# Patient Record
Sex: Female | Born: 1950
Health system: Southern US, Community
[De-identification: ages and names within clinical notes are randomized; demographics above are authoritative.]

## PROBLEM LIST (undated history)

## (undated) DIAGNOSIS — M810 Age-related osteoporosis without current pathological fracture: Secondary | ICD-10-CM

## (undated) DIAGNOSIS — R002 Palpitations: Secondary | ICD-10-CM

## (undated) DIAGNOSIS — E041 Nontoxic single thyroid nodule: Secondary | ICD-10-CM

## (undated) DIAGNOSIS — R030 Elevated blood-pressure reading, without diagnosis of hypertension: Secondary | ICD-10-CM

## (undated) DIAGNOSIS — G479 Sleep disorder, unspecified: Secondary | ICD-10-CM

## (undated) DIAGNOSIS — F411 Generalized anxiety disorder: Secondary | ICD-10-CM

## (undated) DIAGNOSIS — J310 Chronic rhinitis: Secondary | ICD-10-CM

## (undated) DIAGNOSIS — N952 Postmenopausal atrophic vaginitis: Secondary | ICD-10-CM

## (undated) DIAGNOSIS — J019 Acute sinusitis, unspecified: Secondary | ICD-10-CM

## (undated) DIAGNOSIS — IMO0001 Reserved for inherently not codable concepts without codable children: Secondary | ICD-10-CM

## (undated) DIAGNOSIS — K5780 Diverticulitis of intestine, part unspecified, with perforation and abscess without bleeding: Secondary | ICD-10-CM

## (undated) DIAGNOSIS — M8580 Other specified disorders of bone density and structure, unspecified site: Secondary | ICD-10-CM

## (undated) DIAGNOSIS — K589 Irritable bowel syndrome without diarrhea: Secondary | ICD-10-CM

## (undated) HISTORY — DX: Nontoxic single thyroid nodule: E04.1

## (undated) HISTORY — DX: Sleep disorder, unspecified: G47.9

## (undated) HISTORY — DX: Other specified disorders of bone density and structure, unspecified site: M85.80

## (undated) HISTORY — DX: Reserved for inherently not codable concepts without codable children: IMO0001

## (undated) HISTORY — DX: Acute sinusitis, unspecified: J01.90

## (undated) HISTORY — DX: Palpitations: R00.2

## (undated) HISTORY — DX: Irritable bowel syndrome without diarrhea: K58.9

## (undated) HISTORY — DX: Chronic rhinitis: J31.0

## (undated) HISTORY — DX: Generalized anxiety disorder: F41.1

## (undated) HISTORY — DX: Postmenopausal atrophic vaginitis: N95.2

## (undated) HISTORY — PX: BREAST SURGERY: SHX581

## (undated) HISTORY — DX: Elevated blood-pressure reading, without diagnosis of hypertension: R03.0

## (undated) HISTORY — DX: Age-related osteoporosis without current pathological fracture: M81.0

---
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
## 1993-12-25 HISTORY — PX: TEMPOROMANDIBULAR JOINT SURGERY: SHX35

## 2001-12-03 ENCOUNTER — Encounter: Payer: Self-pay | Admitting: *Deleted

## 2001-12-03 ENCOUNTER — Ambulatory Visit (HOSPITAL_COMMUNITY): Admission: RE | Admit: 2001-12-03 | Discharge: 2001-12-03 | Payer: Self-pay | Admitting: *Deleted

## 2002-01-15 ENCOUNTER — Other Ambulatory Visit: Admission: RE | Admit: 2002-01-15 | Discharge: 2002-01-15 | Payer: Self-pay | Admitting: Obstetrics and Gynecology

## 2002-08-27 ENCOUNTER — Encounter: Payer: Self-pay | Admitting: *Deleted

## 2002-08-27 ENCOUNTER — Encounter: Admission: RE | Admit: 2002-08-27 | Discharge: 2002-08-27 | Payer: Self-pay | Admitting: *Deleted

## 2002-12-25 HISTORY — PX: DILATION AND CURETTAGE OF UTERUS: SHX78

## 2003-01-05 ENCOUNTER — Ambulatory Visit (HOSPITAL_COMMUNITY): Admission: RE | Admit: 2003-01-05 | Discharge: 2003-01-05 | Payer: Self-pay | Admitting: Gastroenterology

## 2003-01-05 ENCOUNTER — Encounter: Payer: Self-pay | Admitting: Gastroenterology

## 2003-01-08 ENCOUNTER — Ambulatory Visit (HOSPITAL_COMMUNITY): Admission: RE | Admit: 2003-01-08 | Discharge: 2003-01-08 | Payer: Self-pay | Admitting: Gastroenterology

## 2003-01-08 ENCOUNTER — Encounter: Payer: Self-pay | Admitting: Gastroenterology

## 2003-03-18 ENCOUNTER — Other Ambulatory Visit: Admission: RE | Admit: 2003-03-18 | Discharge: 2003-03-18 | Payer: Self-pay | Admitting: *Deleted

## 2003-09-16 ENCOUNTER — Encounter (INDEPENDENT_AMBULATORY_CARE_PROVIDER_SITE_OTHER): Payer: Self-pay | Admitting: *Deleted

## 2003-09-16 ENCOUNTER — Ambulatory Visit (HOSPITAL_COMMUNITY): Admission: RE | Admit: 2003-09-16 | Discharge: 2003-09-16 | Payer: Self-pay | Admitting: Obstetrics and Gynecology

## 2003-10-07 ENCOUNTER — Encounter: Admission: RE | Admit: 2003-10-07 | Discharge: 2003-10-07 | Payer: Self-pay | Admitting: Obstetrics and Gynecology

## 2003-10-07 ENCOUNTER — Encounter: Payer: Self-pay | Admitting: Obstetrics and Gynecology

## 2004-04-13 ENCOUNTER — Other Ambulatory Visit: Admission: RE | Admit: 2004-04-13 | Discharge: 2004-04-13 | Payer: Self-pay | Admitting: Obstetrics and Gynecology

## 2004-08-15 ENCOUNTER — Ambulatory Visit (HOSPITAL_COMMUNITY): Admission: RE | Admit: 2004-08-15 | Discharge: 2004-08-15 | Payer: Self-pay | Admitting: Obstetrics and Gynecology

## 2004-10-10 ENCOUNTER — Ambulatory Visit (HOSPITAL_COMMUNITY): Admission: RE | Admit: 2004-10-10 | Discharge: 2004-10-10 | Payer: Self-pay | Admitting: Obstetrics and Gynecology

## 2004-10-18 ENCOUNTER — Ambulatory Visit (HOSPITAL_COMMUNITY): Admission: RE | Admit: 2004-10-18 | Discharge: 2004-10-18 | Payer: Self-pay | Admitting: Obstetrics and Gynecology

## 2005-12-05 ENCOUNTER — Encounter: Payer: Self-pay | Admitting: Internal Medicine

## 2010-01-25 LAB — CONVERTED CEMR LAB: Pap Smear: NORMAL

## 2010-09-22 ENCOUNTER — Encounter: Payer: Self-pay | Admitting: Internal Medicine

## 2010-10-04 ENCOUNTER — Ambulatory Visit: Payer: Self-pay | Admitting: Internal Medicine

## 2010-10-04 DIAGNOSIS — E041 Nontoxic single thyroid nodule: Secondary | ICD-10-CM | POA: Insufficient documentation

## 2010-10-04 DIAGNOSIS — IMO0001 Reserved for inherently not codable concepts without codable children: Secondary | ICD-10-CM

## 2010-10-04 DIAGNOSIS — R030 Elevated blood-pressure reading, without diagnosis of hypertension: Secondary | ICD-10-CM

## 2010-10-04 DIAGNOSIS — G479 Sleep disorder, unspecified: Secondary | ICD-10-CM | POA: Insufficient documentation

## 2010-10-04 HISTORY — DX: Sleep disorder, unspecified: G47.9

## 2010-10-04 HISTORY — DX: Nontoxic single thyroid nodule: E04.1

## 2010-10-04 HISTORY — DX: Reserved for inherently not codable concepts without codable children: IMO0001

## 2010-10-04 HISTORY — DX: Elevated blood-pressure reading, without diagnosis of hypertension: R03.0

## 2010-10-18 ENCOUNTER — Encounter: Payer: Self-pay | Admitting: Internal Medicine

## 2010-10-20 ENCOUNTER — Encounter: Payer: Self-pay | Admitting: Internal Medicine

## 2010-10-26 ENCOUNTER — Ambulatory Visit: Payer: Self-pay | Admitting: Internal Medicine

## 2010-10-26 DIAGNOSIS — K589 Irritable bowel syndrome without diarrhea: Secondary | ICD-10-CM

## 2010-10-26 DIAGNOSIS — F411 Generalized anxiety disorder: Secondary | ICD-10-CM

## 2010-10-26 DIAGNOSIS — R002 Palpitations: Secondary | ICD-10-CM

## 2010-10-26 HISTORY — DX: Generalized anxiety disorder: F41.1

## 2010-10-26 HISTORY — DX: Irritable bowel syndrome, unspecified: K58.9

## 2010-10-26 HISTORY — DX: Palpitations: R00.2

## 2010-11-02 ENCOUNTER — Encounter: Payer: Self-pay | Admitting: *Deleted

## 2010-11-02 LAB — CONVERTED CEMR LAB
BUN: 20 mg/dL (ref 6–23)
CO2: 32 meq/L (ref 19–32)
Calcium: 10.1 mg/dL (ref 8.4–10.5)
Chloride: 101 meq/L (ref 96–112)
Creatinine, Ser: 0.7 mg/dL (ref 0.4–1.2)
Free T4: 1.07 ng/dL (ref 0.60–1.60)
GFR calc non Af Amer: 88.21 mL/min (ref >60)
Glucose, Bld: 84 mg/dL (ref 70–99)
Potassium: 4.8 meq/L (ref 3.5–5.1)
Sodium: 142 meq/L (ref 135–145)
T3, Free: 2.2 pg/mL — ABNORMAL LOW (ref 2.3–4.2)
TSH: 0.66 microintl units/mL (ref 0.35–5.50)

## 2010-11-03 ENCOUNTER — Telehealth: Payer: Self-pay | Admitting: *Deleted

## 2010-11-08 ENCOUNTER — Encounter: Payer: Self-pay | Admitting: Internal Medicine

## 2010-11-10 ENCOUNTER — Encounter: Payer: Self-pay | Admitting: Internal Medicine

## 2010-11-23 ENCOUNTER — Ambulatory Visit: Payer: Self-pay | Admitting: Internal Medicine

## 2010-11-23 DIAGNOSIS — J310 Chronic rhinitis: Secondary | ICD-10-CM

## 2010-11-23 HISTORY — DX: Chronic rhinitis: J31.0

## 2010-11-28 ENCOUNTER — Ambulatory Visit: Payer: Self-pay | Admitting: Internal Medicine

## 2010-11-28 DIAGNOSIS — J019 Acute sinusitis, unspecified: Secondary | ICD-10-CM | POA: Insufficient documentation

## 2010-11-28 DIAGNOSIS — J069 Acute upper respiratory infection, unspecified: Secondary | ICD-10-CM | POA: Insufficient documentation

## 2010-11-28 HISTORY — DX: Acute sinusitis, unspecified: J01.90

## 2010-12-06 ENCOUNTER — Telehealth: Payer: Self-pay | Admitting: Internal Medicine

## 2010-12-13 ENCOUNTER — Telehealth: Payer: Self-pay | Admitting: *Deleted

## 2010-12-15 ENCOUNTER — Ambulatory Visit: Payer: Self-pay | Admitting: Family Medicine

## 2010-12-30 ENCOUNTER — Ambulatory Visit: Admit: 2010-12-30 | Payer: Self-pay | Admitting: Family Medicine

## 2011-01-02 ENCOUNTER — Encounter: Payer: Self-pay | Admitting: Internal Medicine

## 2011-01-24 NOTE — Progress Notes (Signed)
  Phone Note Call from Patient   Caller: Patient Call For: Madelin Headings MD Summary of Call: Pt. would like lab results. [phone] Initial call taken by: Lynann Beaver CMA AAMA,  November 03, 2010 10:56 AM  Follow-up for Phone Call        please see my append  Follow-up by: Madelin Headings MD,  November 03, 2010 12:26 PM  Additional Follow-up for Phone Call Additional follow up Details #1::        Lab results are normal. Pt aware. Additional Follow-up by: Romualdo Bolk, CMA Duncan Dull),  November 03, 2010 12:27 PM

## 2011-01-24 NOTE — Letter (Signed)
Summary: [facility]  [facility]   Imported By: Maryln Gottron 11/10/2010 08:47:53  _____________________________________________________________________  External Attachment:    Type:   Image     Comment:   External Document

## 2011-01-24 NOTE — Letter (Signed)
Summary: Records from [facility] 2009 - 2011  Records from [facility] 2009 - 2011   Imported By: Maryln Gottron 11/11/2010 13:10:11  _____________________________________________________________________  External Attachment:    Type:   Image     Comment:   External Document

## 2011-01-24 NOTE — Letter (Signed)
Summary: [facility]  [facility]   Imported By: Maryln Gottron 11/15/2010 10:16:49  _____________________________________________________________________  External Attachment:    Type:   Image     Comment:   External Document

## 2011-01-24 NOTE — Assessment & Plan Note (Signed)
Summary: SINUSITIS? // RS   Vital Signs:  Patient profile:   60 year old female Menstrual status:  postmenopausal Weight:      123 pounds O2 Sat:      99 % on Room air Temp:     98.6 degrees F oral Pulse rate:   78 / minute BP sitting:   120 / 80  (left arm) Cuff size:   regular  Vitals Entered By: Romualdo Bolk, CMA (AAMA) (November 28, 2010 11:49 AM)  O2 Flow:  Room air CC: No voice on 12/1-12/3. Then on 12/3 nasal congestion with coughing and chest congestion in am. Some sinus pressure on rt side and ha's.   History of Present Illness: Denise Young comes in today  for above  .  indeed her symptoms progressed  from last visit and had the sudden  onset of laryngitis  and then head congestoin since then. Like a   head cold  .  NO fever.    Right sided face  pain  was problematic esp bending over yesterday     somewhat better  today .     But has to travel back to [place]  this week  to help with mom .     Bp doing better and not on meds as before.  No fever sig cough or cp sob .        Preventive Screening-Counseling & Management  Alcohol-Tobacco     Alcohol drinks/day: 0     Smoking Status: never     Year Quit: 1982     Tobacco Counseling: not to resume use of tobacco products  Caffeine-Diet-Exercise     Caffeine use/day: less than 1 a day     Does Patient Exercise: yes     Type of exercise: walking  Current Medications (verified): 1)  Diazepam 5 Mg Tabs (Diazepam) .Marland Kitchen.. 1 By Mouth At Bedtime As Needed For Muscle Spasms 2)  Propranolol Hcl 10 Mg Tabs (Propranolol Hcl) .Marland Kitchen.. 1 By Mouth Three Times A Day As Needed For Blood Pressure  Currently Not Taking 3)  Metoprolol Succinate 25 Mg Xr24h-Tab (Metoprolol Succinate) .... Take 1/2 By Mouth Once Daily  For  Supression of Palpitations and High Blood Pressure  or As Directed  Not Taking  Allergies (verified): 1)  Neomycin 2)  Adhesive Tape  Past History:  Past medical, surgical, family and social histories  (including risk factors) reviewed, and no changes noted (except as noted below).  Past Medical History: Reviewed history from 10/26/2010 and no changes required. thyroid nodule  yearly check   G1P1  last PAP 2 2011  Hx breech birth csection preeclampsia DEXA mild osteopenia  -1.7 fem neck and -2.0 LSsp 2 2011 Fibromyalgia    age 60 post partum  Elevated  BP readings .  Increase on hrt  ETT 2009  negative  failr exercise tolerance Echo cardiogram 12 2008  nl LV " difficult technically" IBS Psoriasis Hx of PUD by 1gd 1980s   Past Surgical History: Reviewed history from 10/04/2010 and no changes required. C-Section 1989 2 Rt breast bx- 1992 and 1994 Left TMJ surgery 1995 Colonoscopy 2008  Past History:  Care Management: Endocrinology: Alheimer thyroid nodule [facility]: Dr Sid Falcon  Family History: Reviewed history from 10/04/2010 and no changes required. Father: Deceased age 55- Heart disease, Eye Problems- unsure of type Mother: Mild HBP, hypothryroid  Siblings: 1) brother- scramouse ca of neck, hypothryroid 2) Brother- open heart surgery, by pass,  hbp, dm 3)Sister-Hypothryoidism 4) Sister- Migraines 5) Brother- HBP and High cholesterol  Social History: Reviewed history from 10/26/2010 and no changes required. 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  The patient denies anorexia, fever, weight loss, weight gain, vision loss, chest pain, hemoptysis, difficulty walking, abnormal bleeding, and enlarged lymph nodes.    Physical Exam  General:  Well-developed,well-nourished,in no acute distress; alert,appropriate and cooperative throughout examinationcongested and midly hoarse Head:  normocephalic and atraumatic.   Eyes:  vision grossly intact, pupils equal, and pupils round.     Ears:  R ear normal, L ear normal, and no external deformities.   Nose:  no external deformity and no external erythema.  congested   no discrete tenderenss right  Mouth:  pharynx pink and moist.   Neck:  No deformities, masses, or tenderness noted. Lungs:  Normal respiratory effort, chest expands symmetrically. Lungs are clear to auscultation, no crackles or wheezes. Heart:  Normal rate and regular rhythm. S1 and S2 normal without gallop, murmur, click, rub or other extra sounds. Skin:  turgor normal, color normal, no ecchymoses, and no petechiae.   Cervical Nodes:  No lymphadenopathy noted Psych:  Oriented X3, normally interactive, good eye contact, and not depressed appearing.     Impression & Recommendations:  Problem # 1:  SINUSITIS - ACUTE-NOS (ICD-461.9) Expectant management  add med if needed  Her updated medication list for this problem includes:    Amoxicillin 500 Mg Caps (Amoxicillin) .Marland Kitchen... 1 by mouth three times a day for sinusitis  Problem # 2:  URI (ICD-465.9) Assessment: Comment Only  Problem # 3:  ELEVATED BP READING WITHOUT DX HYPERTENSION (ICD-796.2) Assessment: Improved  Her updated medication list for this problem includes:    Propranolol Hcl 10 Mg Tabs (Propranolol hcl) .Marland Kitchen... 1 by mouth three times a day as needed for blood pressure  currently not taking    Metoprolol Succinate 25 Mg Xr24h-tab (Metoprolol succinate) .Marland Kitchen... Take 1/2 by mouth once daily  for  supression of palpitations and high blood pressure  or as directed  not taking  Complete Medication List: 1)  Diazepam 5 Mg Tabs (Diazepam) .Marland Kitchen.. 1 by mouth at bedtime as needed for muscle spasms 2)  Propranolol Hcl 10 Mg Tabs (Propranolol hcl) .Marland Kitchen.. 1 by mouth three times a day as needed for blood pressure  currently not taking 3)  Metoprolol Succinate 25 Mg Xr24h-tab (Metoprolol succinate) .... Take 1/2 by mouth once daily  for  supression of palpitations and high blood pressure  or as directed  not taking 4)   Amoxicillin 500 Mg Caps (Amoxicillin) .Marland Kitchen.. 1 by mouth three times a day for sinusitis  Patient Instructions: 1)  continue   current   therapy  and if right -sided discomfort continues another  1-2 days .    then add the antibiotic .  2)  can call  for advice . Prescriptions: AMOXICILLIN 500 MG CAPS (AMOXICILLIN) 1 by mouth three times a day for sinusitis  #30 x 0   Entered and Authorized by:   Madelin Headings MD   Signed by:   Madelin Headings MD on 11/28/2010   Method used:   Print then Give to Patient   RxID:   [id number]    Orders Added: 1)  Est. Patient Level  IV [id number]

## 2011-01-24 NOTE — Assessment & Plan Note (Signed)
Summary: 1 month fup//ccm   Vital Signs:  Patient profile:   60 year old female Menstrual status:  postmenopausal Weight:      123 pounds O2 Sat:      96 % Temp:     98.3 degrees F oral Pulse rate:   91 / minute Pulse rhythm:   regular BP sitting:   100 / 66  Vitals Entered By: Lynann Beaver CMA AAMA (November 23, 2010 11:22 AM) CC: URI symptoms Is Patient Diabetic? No Pain Assessment Patient in pain? no        History of Present Illness: Denise Young   comes in today  for follow up of bp and also resp issues.  After travel and burning and tingling in nasal passages for 2-3 days.   NOw has to go to [place] for a funeral. asks for advice.  BP doing better and not having to take med. Has tried 1/2 diazepa at night and no real help.  so on nothing at night.  bp normal today.  Preventive Screening-Counseling & Management  Alcohol-Tobacco     Alcohol drinks/day: 0     Smoking Status: never     Year Quit: 1982     Tobacco Counseling: not to resume use of tobacco products  Caffeine-Diet-Exercise     Caffeine use/day: less than 1 a day     Does Patient Exercise: yes     Type of exercise: walking  Current Problems (verified): 1)  Rhinitis  (ICD-472.0) 2)  Other Anxiety States  (ICD-300.09) 3)  Ibs  (ICD-564.1) 4)  Palpitations, Recurrent  (ICD-785.1) 5)  Sleep Disorder/disturbance  (ICD-780.50) 6)  Elevated Bp Reading Without Dx Hypertension  (ICD-796.2) 7)  Thyroid Nodule  (ICD-241.0) 8)  Fibromyalgia  (ICD-729.1)  Current Medications (verified): 1)  Diazepam 5 Mg Tabs (Diazepam) .Marland Kitchen.. 1 By Mouth At Bedtime As Needed For Muscle Spasms 2)  Propranolol Hcl 10 Mg Tabs (Propranolol Hcl) .Marland Kitchen.. 1 By Mouth Three Times A Day As Needed For Blood Pressure 3)  Metoprolol Succinate 25 Mg Xr24h-Tab (Metoprolol Succinate) .... Take 1/2 By Mouth Once Daily  For  Supression of Palpitations and High Blood Pressure  or As Directed  Allergies (verified): 1)  Neomycin 2)  Adhesive  Tape  Past History:  Past medical, surgical, family and social histories (including risk factors) reviewed, and no changes noted (except as noted below).  Past Medical History: Reviewed history from 10/26/2010 and no changes required. thyroid nodule  yearly check   G1P1  last PAP 2 2011  Hx breech birth csection preeclampsia DEXA mild osteopenia  -1.7 fem neck and -2.0 LSsp 2 2011 Fibromyalgia    age 32 post partum  Elevated  BP readings .  Increase on hrt  ETT 2009  negative  failr exercise tolerance Echo cardiogram 12 2008  nl LV " difficult technically" IBS Psoriasis Hx of PUD by 1gd 1980s   Past Surgical History: Reviewed history from 10/04/2010 and no changes required. C-Section 1989 2 Rt breast bx- 1992 and 1994 Left TMJ surgery 1995 Colonoscopy 2008  Family History: Reviewed history from 10/04/2010 and no changes required. Father: Deceased age 51- Heart disease, Eye Problems- unsure of type Mother: Mild HBP, hypothryroid  Siblings: 1) brother- scramouse ca of neck, hypothryroid 2) Brother- open heart surgery, by pass, hbp, dm 3)Sister-Hypothryoidism 4) Sister- Migraines 5) Brother- HBP and High cholesterol  Social History: Reviewed history from 10/26/2010 and no changes required. 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    Physical Exam  General:  Well-developed,well-nourished,in no acute distress; alert,appropriate and cooperative throughout examination Head:  normocephalic and atraumatic.   Eyes:  vision grossly intact, pupils equal, and pupils round.   Ears:  R ear normal and L ear normal.   Nose:  no external deformity, no  external erythema, and no nasal discharge.  mild erythema no face tenderness Mouth:  pharynx pink and moist.   Neck:  No deformities, masses, or tenderness noted. Lungs:  normal respiratory effort, no intercostal retractions, and no accessory muscle use.   Heart:  normal rate, regular rhythm, and no murmur.     Impression & Recommendations:  Problem # 1:  RHINITIS (ICD-472.0) eary viral vs environmental dryness and irritation.  Expectant management   saline etc.  call with alarm symptoms   Problem # 2:  ELEVATED BP READING WITHOUT DX HYPERTENSION (ICD-796.2) good bp readings now and not taking meds  because doing wll  tends to runelevated HT   .  Her updated medication list for this problem includes:    Propranolol Hcl 10 Mg Tabs (Propranolol hcl) .Marland Kitchen... 1 by mouth three times a day as needed for blood pressure  currently not taking    Metoprolol Succinate 25 Mg Xr24h-tab (Metoprolol succinate) .Marland Kitchen... Take 1/2 by mouth once daily  for  supression of palpitations and high blood pressure  or as directed  not taking  Complete Medication List: 1)  Diazepam 5 Mg Tabs (Diazepam) .Marland Kitchen.. 1 by mouth at bedtime as needed for muscle spasms 2)  Propranolol Hcl 10 Mg Tabs (Propranolol hcl) .Marland Kitchen.. 1 by mouth three times a day as needed for blood pressure  currently not taking 3)  Metoprolol Succinate 25 Mg Xr24h-tab (Metoprolol succinate) .... Take 1/2 by mouth once daily  for  supression of palpitations and high blood pressure  or as directed  not taking  Patient Instructions: 1)  continue saline   washes   2)  cool mist vaporizer at night  3)  plain Mucinex or variation should be ok .  4)  This could get worse before it  gets better if its a viral resp infection. 5)  Tylenol  is ok.     Orders Added: 1)  Est. Patient Level III [04540]

## 2011-01-24 NOTE — Letter (Signed)
Summary: PT. HX FORM  PT. HX FORM   Imported By: Georgian Co 11/10/2010 11:04:45  _____________________________________________________________________  External Attachment:    Type:   Image     Comment:   External Document

## 2011-01-24 NOTE — Assessment & Plan Note (Signed)
Summary: elevated bp//ccm   Vital Signs:  Patient profile:   60 year old female Menstrual status:  postmenopausal Weight:      122 pounds Pulse rate:   84 / minute BP sitting:   130 / 74  (right arm) Cuff size:   regular  Vitals Entered By: Romualdo Bolk, CMA (AAMA) (October 26, 2010 3:18 PM)  Serial Vital Signs/Assessments:  Time      Position  BP       Pulse  Resp  Temp     By 3:21 PM             137/78   87                    Romualdo Bolk, CMA (AAMA)  Comments: 3:21 PM Pt's machine By: Romualdo Bolk, CMA (AAMA)   CC: BP elevated   History of Present Illness: Denise Young comes in today  for  problem  as an sda.  She has concerns about her BP readings .  She brings in copy of results most recently  some in 150 /70 range and some in 130s about 50 % of the time. Tkaing prolpranolol as needed  a few times and this helps her.    She gets a pounding   feeling in back and neck and up to 98 and 119  then take proprnolol as a as needed with help.   She brings in a copy of echo cardiogram done in the past 12/08 for palpitations  tis was normal   She may have also had an event monitor  wihtout sig abnormality in the past.   She thinks that stress could be alarge part of problem  .    Gets anxious with this. Worried if thyroid could be off also.  Preventive Screening-Counseling & Management  Alcohol-Tobacco     Alcohol drinks/day: 0     Smoking Status: never     Year Quit: 1982     Tobacco Counseling: not to resume use of tobacco products  Caffeine-Diet-Exercise     Caffeine use/day: less than 1 a day     Does Patient Exercise: yes     Type of exercise: walking  Contraindications/Deferment of Procedures/Staging:    Test/Procedure: FLU VAX    Reason for deferment: patient declined   Current Medications (verified): 1)  Diazepam 5 Mg Tabs (Diazepam) .Marland Kitchen.. 1 By Mouth At Bedtime As Needed For Muscle Spasms 2)  Propranolol Hcl 10 Mg Tabs (Propranolol Hcl) .Marland Kitchen..  1 By Mouth Three Times A Day As Needed For Blood Pressure 3)  Diphenhydramine Hcl 25 Mg Caps (Diphenhydramine Hcl)  Allergies (verified): 1)  Neomycin 2)  Adhesive Tape  Past History:  Past medical, surgical, family and social histories (including risk factors) reviewed, and no changes noted (except as noted below).  Past Medical History: thyroid nodule  yearly check   G1P1  last PAP 2 2011  Hx breech birth csection preeclampsia DEXA mild osteopenia  -1.7 fem neck and -2.0 LSsp 2 2011 Fibromyalgia    age 34 post partum  Elevated  BP readings .  Increase on hrt  ETT 2009  negative  failr exercise tolerance Echo cardiogram 12 2008  nl LV " difficult technically" IBS Psoriasis Hx of PUD by 1gd 1980s   Past Surgical History: Reviewed history from 10/04/2010 and no changes required. C-Section 1989 2 Rt breast bx- 1992 and 1994 Left TMJ surgery 1995 Colonoscopy 2008  Past History:  Care Management: Endocrinology: Alheimer thyroid nodule [facility]: Dr Sid Falcon  Family History: Reviewed history from 10/04/2010 and no changes required. Father: Deceased age 11- Heart disease, Eye Problems- unsure of type Mother: Mild HBP, hypothryroid  Siblings: 1) brother- scramouse ca of neck, hypothryroid 2) Brother- open heart surgery, by pass, hbp, dm 3)Sister-Hypothryoidism 4) Sister- Migraines 5) Brother- HBP and High cholesterol  Social History: Reviewed history from 10/04/2010 and no changes required. 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    Physical Exam  General:  Well-developed,well-nourished,in no acute distress; alert,appropriate and cooperative throughout examination mildy anxious  Neck:  No deformities, masses, or tenderness noted. Lungs:  Normal respiratory effort, chest expands symmetrically. Lungs are clear to auscultation, no crackles or wheezes. Heart:  Normal rate and regular rhythm. S1 and S2 normal without gallop, murmur, click, rub or other extra sounds.no lifts.   Pulses:  pulses intact without delay   Extremities:  no clubbing cyanosis or edema  Neurologic:  grssly non focal  Skin:  turgor normal and color normal.   Cervical Nodes:  No lymphadenopathy noted Psych:  Oriented X3, normally interactive, good eye contact, not depressed appearing, and slightly anxious.   nl mentation   Impression & Recommendations:  Problem # 1:  PALPITATIONS, RECURRENT (ICD-785.1) hx of same and prev evlauations.   no dx.  Her updated medication list for this problem includes:    Propranolol Hcl 10 Mg Tabs (Propranolol hcl) .Marland Kitchen... 1 by mouth three times a day as needed for blood pressure    Metoprolol Succinate 25 Mg Xr24h-tab (Metoprolol succinate) .Marland Kitchen... Take 1/2 by mouth once daily  for  supression of palpitations and high blood pressure  or as directed  Orders: Specimen Handling (81191) Venipuncture (47829) TLB-BMP (Basic Metabolic Panel-BMET) (80048-METABOL) TLB-TSH (Thyroid Stimulating Hormone) (84443-TSH) TLB-T4 (Thyrox), Free [phone]) TLB-T3, Free (Triiodothyronine) (84481-T3FREE)  Problem # 2:  ELEVATED BP READING WITHOUT DX HYPERTENSION (ICD-796.2) review of her  readings areelevated about 50% of the time   reasonable to use b blocker on a reg basis low dose  Her updated medication list for this problem includes:    Propranolol Hcl 10 Mg Tabs (Propranolol hcl) .Marland Kitchen... 1 by mouth three times a day as needed for blood pressure    Metoprolol Succinate 25 Mg Xr24h-tab (Metoprolol succinate) .Marland Kitchen...  Take 1/2 by mouth once daily  for  supression of palpitations and high blood pressure  or as directed  Orders: Specimen Handling (78469) Venipuncture (62952) TLB-BMP (Basic Metabolic Panel-BMET) (80048-METABOL) TLB-TSH (Thyroid Stimulating Hormone) (84443-TSH)  Problem # 3:  FIBROMYALGIA (ICD-729.1)  Problem # 4:  OTHER ANXIETY STATES (ICD-300.09) disc risk benefit of valium type medications Her updated medication list for this problem includes:    Diazepam 5 Mg Tabs (Diazepam) .Marland Kitchen... 1 by mouth at bedtime as needed for muscle spasms  Complete Medication List: 1)  Diazepam 5 Mg Tabs (Diazepam) .Marland Kitchen.. 1 by mouth at bedtime as needed for muscle spasms 2)  Propranolol Hcl 10 Mg Tabs (Propranolol hcl) .Marland Kitchen.. 1 by mouth three times a day as needed for blood pressure 3)  Diphenhydramine Hcl  25 Mg Caps (Diphenhydramine hcl) 4)  Metoprolol Succinate 25 Mg Xr24h-tab (Metoprolol succinate) .... Take 1/2 by mouth once daily  for  supression of palpitations and high blood pressure  or as directed  Patient Instructions: 1)  take  1/2 of the diazepam hs for 3 night s to see    if helps day time   problems.   or sleep. 2)  consider   daily low dose  beta blocker  3)  You will be informed of lab results when available.  4)  Please schedule a follow-up appointment in 1 month.  Prescriptions: METOPROLOL SUCCINATE 25 MG XR24H-TAB (METOPROLOL SUCCINATE) take 1/2 by mouth once daily  for  supression of palpitations and high blood pressure  or as directed  #30 x 1   Entered and Authorized by:   Madelin Headings MD   Signed by:   Madelin Headings MD on 10/26/2010   Method used:   Print then Give to Patient   RxID:   [id number]    Orders Added: 1)  Specimen Handling [99000] 2)  Venipuncture [36415] 3)  TLB-BMP (Basic Metabolic Panel-BMET) [80048-METABOL] 4)  TLB-TSH (Thyroid Stimulating Hormone) [84443-TSH] 5)  TLB-T4 (Thyrox), Free [13244-WN0U] 6)  TLB-T3, Free (Triiodothyronine) [72536-U4QIHK] 7)   Est. Patient Level V [74259]   greater than 50% of visit spent in counseling  40 minutes     Appended Document: elevated bp//ccm recieved  OV report  from [facility] note from 10 /27   after above visit.  At that visit it is reported that she is was given bystolic 2.5 and had some kind of side effect .

## 2011-01-24 NOTE — Assessment & Plan Note (Signed)
Summary: BRAND NEW PT/TO EST/CJR   Vital Signs:  Patient profile:   60 year old female Menstrual status:  postmenopausal Height:      63.75 inches Weight:      121 pounds BMI:     21.01 Pulse rate:   72 / minute BP sitting:   110 / 70  (left arm) Cuff size:   regular  Vitals Entered By: Romualdo Bolk, CMA (AAMA) (October 04, 2010 1:32 PM)  Serial Vital Signs/Assessments:  Time      Position  BP       Pulse  Resp  Temp     By                     138/78                         Madelin Headings MD  Comments: right arm sitting  By: Madelin Headings MD   CC: New Pt to get establish-Discuss flu shot. Pt has been having problems sleeping and pulse has been going fast.     Menstrual Status postmenopausal Last PAP Result normal   History of Present Illness: Denise Young comes in today    as a new patient to establish . Her previous care was in [place]  from whence she moved  July . Moved back to [facility] where she had lived  from  2005  .    She describes  her situation as having  "FM   and challenges. "  no acute  illnesses.   uses  diazepam as needed spasm   .  not taken  very often.  She is very sensitive to meds such as neosporin adhesives and latex   She has also had a se of a flu vaccine in the past  but not allergic in nature  Preventive Care Screening  Pap Smear:    Date:  01/25/2010    Results:  normal   Mammogram:    Date:  12/26/2007    Results:  normal   Colonoscopy:    Date:  12/25/2006    Results:  normal   Last Tetanus Booster:    Date:  12/25/2000    Results:  Historical    Preventive Screening-Counseling & Management  Alcohol-Tobacco     Alcohol drinks/day: 0     Smoking Status: never     Year Quit: 1982     Tobacco Counseling: not to resume use of tobacco products  Caffeine-Diet-Exercise     Caffeine use/day: less than 1 a day     Does Patient Exercise: yes     Type of exercise: walking  Comments: 12 years of tobacco       Drug Use:   no.    Current Medications (verified): 1)  Diazepam 5 Mg Tabs (Diazepam) .Marland Kitchen.. 1 By Mouth At Bedtime As Needed For Muscle Spasms 2)  Propranolol Hcl 10 Mg Tabs (Propranolol Hcl) .Marland Kitchen.. 1 By Mouth Three Times A Day As Needed For Blood Pressure 3)  Diphenhydramine Hcl 25 Mg Caps (Diphenhydramine Hcl)  Allergies (verified): 1)  Neomycin 2)  Adhesive Tape  Past History:  Past Medical History: thyroid nodule  yearly check   G1P1  last PAP 2 2011 DEXA mild osteopenia  -1.7 fem neck and -2.0 LSsp 2 2011 Fibromyalgia    age 32 post partum  Elevated  BP readings .  ETT 2009  negative  failr exercise tolerance  Past Surgical History: C-Section 1989 2 Rt breast bx- 1992 and 1994 Left TMJ surgery 1995 Colonoscopy 2008  Past History:  Care Management: Endocrinology: Alheimer thyroid nodule [facility] Dr Sid Falcon  Family History: Father: Deceased age 58- Heart disease, Eye Problems- unsure of type Mother: Mild HBP, hypothryroid  Siblings: 1) brother- scramouse ca of neck, hypothryroid 2) Brother- open heart surgery, by pass, hbp, dm 3)Sister-Hypothryoidism 4) Sister- Migraines 5) Brother- HBP and High cholesterol  Social History: Married   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         bp up and down  160 and pulse 98   taking propranolol.      as needed.  sleep is an issue.     Physical Exam  General:   Well-developed,well-nourished,in no acute distress; alert,appropriate and cooperative throughout examination Head:  normocephalic and atraumatic.   Eyes:  vision grossly intact, pupils equal, and pupils round.   Ears:  R ear normal, L ear normal, and no external deformities.   Nose:  no external deformity and no external erythema.   Mouth:  pharynx pink and moist.   Neck:  No deformities, masses, or tenderness noted. Lungs:  Normal respiratory effort, chest expands symmetrically. Lungs are clear to auscultation, no crackles or wheezes.no dullness.   Heart:  Normal rate and regular rhythm. S1 and S2 normal without gallop, murmur, click, rub or other extra sounds.no lifts.   Abdomen:  Bowel sounds positive,abdomen soft and non-tender without masses, organomegaly or   noted. Msk:  no joint swelling and no joint warmth.   Pulses:  pulses intact without delay   Extremities:  no clubbing cyanosis or edema  Neurologic:   alert & oriented X3 and gait normal.  non focal  Skin:  turgor normal, color normal, no ecchymoses, and no petechiae.   Cervical Nodes:  No lymphadenopathy noted Psych:  Oriented X3, good eye contact, not anxious appearing, and not depressed appearing.     Impression & Recommendations:  Problem # 1:  ELEVATED BP READING WITHOUT DX HYPERTENSION (ICD-796.2) hx of same .  also palpititations and used b blocker as needed.    hx of ett in 2009 and neg for ischemia Her updated medication list for this problem includes:    Propranolol Hcl 10 Mg Tabs (Propranolol hcl) .Marland Kitchen... 1 by mouth three times a day as needed for blood pressure  Problem # 2:  THYROID NODULE (ICD-241.0) followerd by endocrine an stable by report   Problem # 3:  FIBROMYALGIA (ICD-729.1) stable  acitivity.    Problem # 4:  SLEEP DISORDER/DISTURBANCE (ICD-780.50) could be related to Fm and or stransitions  Complete Medication List: 1)  Diazepam 5 Mg Tabs (Diazepam) .Marland Kitchen.. 1 by mouth at bedtime as needed for muscle  spasms 2)  Propranolol Hcl 10 Mg Tabs (Propranolol hcl) .Marland Kitchen.. 1 by mouth three times a day as needed for blood pressure 3)  Diphenhydramine Hcl 25 Mg Caps (Diphenhydramine hcl)  Patient Instructions: 1)  revisit sleep hygiene .  2)  avoid  back lighting  before bed.  3)  Cpx and labs in February  2012.  4)  Call if BP readings  over 160. 5)  bring in  a summary of your readings when come in then .  records  from previous caretakers  obtained and reviewed

## 2011-01-24 NOTE — Letter (Signed)
Summary: Generic Letter  [place] [facility]  [address] [place], [place] [address]   Phone: [phone]  Fax: [phone]    11/02/2010  [facility] [address] [place], [place]  [address]  Dear Ms. Fraiser,   (1) BMP (METABOL)   Sodium                    142 mEq/L                   135-145   Potassium                 4.8 mEq/L                   3.5-5.1   Chloride                  101 mEq/L                   96-112   Carbon Dioxide            32 mEq/L                    19-32   Glucose                   84 mg/dL                    27-25   BUN                       20 mg/dL                    3-66   Creatinine                0.7 mg/dL                   4.4-0.3   Calcium                   10.1 mg/dL                  4.7-42.5   GFR                       88.21 mL/min                >60  Tests: (2) TSH (TSH)   FastTSH                   0.66 uIU/mL                 0.35-5.50  Tests: (3) T4, Free (FT4R)   Free T4                   1.07 ng/dL                  0.60-1.60  Tests: (4) T3, Free (T3FREE)   Free T3              [L]  2.2 pg/mL                   2.3-4.2   Your labs are okay and we will discuss this when you come in for your next appt. If you have any questions, please give [place] a call at [phone].     Sincerely,   Tor Netters,  CMA (AAMA)

## 2011-01-26 NOTE — Assessment & Plan Note (Signed)
Summary: EAR PAIN // RS   Vital Signs:  Patient profile:   60 year old female Menstrual status:  postmenopausal Temp:     97.8 degrees F oral BP sitting:   130 / 78  (left arm) Cuff size:   regular  Vitals Entered By: Duard Brady LPN (December 15, 2010 1:53 PM)  History of Present Illness: 3 weeks ago laryngitis and cold symptoms. Eventually started Amox after not getting better. Had insomnia with antibiotic.  changed to Ceftin.  Now has L ear pressure.  No real pain.  No vertigo. No fever.  Denies hearing loss.  Allergies: 1)  Neomycin 2)  Adhesive Tape  Past History:  Past Medical History: Last updated: 10/26/2010 thyroid nodule  yearly check   G1P1  last PAP 2 2011  Hx breech birth csection preeclampsia DEXA mild osteopenia  -1.7 fem neck and -2.0 LSsp 2 2011 Fibromyalgia    age 38 post partum  Elevated  BP readings .  Increase on hrt  ETT 2009  negative  failr exercise tolerance Echo cardiogram 12 2008  nl LV " difficult technically" IBS Psoriasis Hx of PUD by 1gd 1980s   Physical Exam  General:  Well-developed,well-nourished,in no acute distress; alert,appropriate and cooperative throughout examination Ears:  L ear impacted wih cerumen.  R tm minimal cerumen TM normal after removal of cerumen. Mouth:  Oral mucosa and oropharynx without lesions or exudates.  Teeth in good repair. Neck:  No deformities, masses, or tenderness noted. Lungs:  Normal respiratory effort, chest expands symmetrically. Lungs are clear to auscultation, no crackles or wheezes. Heart:  normal rate and regular rhythm.     Impression & Recommendations:  Problem # 1:  IMPACTED CERUMEN (ICD-380.4) removed by currett and irrigation.  Problem # 2:  URI (ICD-465.9) resolving.  Complete Medication List: 1)  Diazepam 5 Mg Tabs (Diazepam) .Marland Kitchen.. 1 by mouth at bedtime as needed for muscle spasms 2)  Propranolol Hcl 10 Mg Tabs (Propranolol hcl) .Marland Kitchen.. 1 by mouth three times a day as needed  for blood pressure  currently not taking 3)  Metoprolol Succinate 25 Mg Xr24h-tab (Metoprolol succinate) .... Take 1/2 by mouth once daily  for  supression of palpitations and high blood pressure  or as directed  not taking 4)  Cefprozil 500 Mg Tabs (Cefprozil) .... One by mouth two times a day x 7 days   Orders Added: 1)  Est. Patient Level III [30865]

## 2011-01-26 NOTE — Progress Notes (Signed)
Summary: ov today-triage please per md  Phone Note Call from Patient Call back at Home Phone [phone]   Caller: Patient Call For: Madelin Headings MD Summary of Call: Pt did not take abx ceftin, she is requesting ov with doc today for sinus problems  Has symptoms of congestion, and stuffiness and dry.  Took Mucinex today. Is afraid of all the side effectd on the Cefzil, and want to be seen instead of taking meds.  The symptoms have been going on 3 weeks. No fever.  No sore throat, and minimal dry cough. Initial call taken by: Drue Stager,  December 13, 2010 10:16 AM  Follow-up for Phone Call        i did not see the message  until late today as   wasa out of the office  for EHR training .    Additional Follow-up for Phone Call Additional follow up Details #1::        call her and see if she want to see another doctor  No appt open today and no answer at pt's phone number. Lynann Beaver CMA AAMA  December 15, 2010 12:31 PM  Additional Follow-up by: Madelin Headings MD,  December 15, 2010 12:24 PM    Additional Follow-up for Phone Call Additional follow up Details #2::    Pt came in to see Dr. Caryl Never Follow-up by: Romualdo Bolk, CMA Duncan Dull),  December 15, 2010 2:11 PM

## 2011-01-26 NOTE — Progress Notes (Signed)
Summary: different abx  Phone Note Call from Patient Call back at Home Phone [phone]   Caller: Patient Call For: Madelin Headings MD Summary of Call: pt had only 3 days of amoxicillin due to insomina for sinus inf. Pt would like a mild abx?ceftin call [facility]. Pt can not take zpack. pt started amoxicillin on 12-02-10. Pt stated rare side effect is insomina per pharm. Initial call taken by: Heron Sabins,  December 06, 2010 11:12 AM  Follow-up for Phone Call        i doubt that that amox ix causing this problem    however can changes to deftin 500 two times a day for 7 more days disp14  Follow-up by: Madelin Headings MD,  December 06, 2010 12:50 PM  Additional Follow-up for Phone Call Additional follow up Details #1::        Pt prefers Cefzil instead of Ceftin because she has taken it before. Additional Follow-up by: Lynann Beaver CMA AAMA,  December 06, 2010 1:27 PM    Additional Follow-up for Phone Call Additional follow up Details #2::    ok cefzil 500 two times a day for 7 more days   disp 14  Follow-up by: Madelin Headings MD,  December 06, 2010 1:38 PM  New/Updated Medications: CEFPROZIL 500 MG TABS (CEFPROZIL) one by mouth two times a day x 7 days Prescriptions: CEFPROZIL 500 MG TABS (CEFPROZIL) one by mouth two times a day x 7 days  #14 x 0   Entered by:   Lynann Beaver CMA AAMA   Authorized by:   Madelin Headings MD   Signed by:   Lynann Beaver CMA AAMA on 12/06/2010   Method used:   Electronically to        [facility]  [phone]* (retail)       [address] [place], [place]  [address]       Ph: [phone] or [phone]       Fax: [phone]   RxID:   [id number]

## 2011-01-26 NOTE — Letter (Signed)
Summary: ADHD Self-Report Scale   ADHD Self-Report Scale   Imported By: Maryln Gottron 12/07/2010 10:03:20  _____________________________________________________________________  External Attachment:    Type:   Image     Comment:   External Document

## 2011-05-12 NOTE — H&P (Signed)
NAME:  Denise Young, Denise Young NO.:  [id number]   MEDICAL RECORD NO.:  [id number]                   PATIENT TYPE:  AMB   LOCATION:  [facility]C                                  FACILITY:  [facility]   PHYSICIAN:  Artist Pais, M.D.                 DATE OF BIRTH:  10/16/1951   DATE OF ADMISSION:  DATE OF DISCHARGE:                                HISTORY & PHYSICAL   DATE OF SCHEDULED SURGERY:  September 16, 2003   HISTORY OF PRESENT ILLNESS:  The patient is a 60 year old Caucasian female  para 1 who presented April 23, 2003 to discuss menopausal symptoms.  However, she did bring her cycle diary at that time which showed her cycles  occurred every 20 to 48 days.  She did have a workup for her irregular  cycles previously by Dr. Eda Paschal a couple of years ago.  She was diagnosed  with menometrorrhagia and had a TSH done previously by Dr. Leslie Dales which  was normal. Urine pregnancy test was negative and CBC was within normal  limits.  The patient underwent a pelvic ultrasound which showed there to be  an endometrium measuring 5.1 mm.  Due to her menorrhagia she subsequently  underwent an endometrial biopsy which showed benign disordered proliferative  pattern with focal simple hyperplasia without atypia.  She was advised to  undergo a D&C hysteroscopy to rule out higher grade coexisting hyperplasia.  Risks of the surgery including anesthetic complication; hemorrhage;  infection; damage to adjacent structures including bladder, bowel, blood  vessels, or ureter were discussed with the patient.  She was made aware of  the risk of uterine perforation which could result in overwhelming life-  threatening hemorrhage requiring emergent hysterectomy, and uterine  perforation which could result in bowel damage resulting in colostomy or  which could result in overwhelming life-threatening peritonitis.  She  expressed understanding of and acceptance of these risks.  I spent a great  deal of time explaining the link between endometrial hyperplasia and  endometrial cancer.  We did attempt to do this surgery two-and-a-half weeks  ago but she began bleeding and so the surgery was cancelled.   PAST MEDICAL HISTORY:  1. Myalgia.  2. Mitral valve prolapse for which the patient requires SBE prophylaxis.  3. Menometorrhagia as above.  4. Thyroid nodule.  5. History of TMJ.   ALLERGIES:  1. NEOSPORIN.  2. LEVAQUIN.  3. ASPIRIN.  4. CODEINE.  5. ERYTHROMYCIN.  6. NOVOCAINE.   The patient states these are not true allergies but she does have a  sensitivity to these and says that she is very sensitive to medications.   CURRENT MEDICATIONS:  None.   HEALTH MAINTENANCE:  The patient's last mammogram according to her chart is  June 07, 2001.  I have not received any additional mammograms.  She promises  to schedule another mammogram and, in addition, she does promise  to schedule  an annual exam.   FAMILY HISTORY:  There is no family history of colon, breast, or prostate  cancer.  The patient's mother has hypertension and hypothyroidism.  Her  father has a history of tachycardia.  She has one brother age 38 alive and  well, another one age 16 alive and well.  One brother age 61 with gout and  alcoholism. One sister age 24 alive and well, and a brother age 62 with  hypertension.  Another sister age 105 alive and well.  One child age of 72  alive and well and a paternal aunt who did have ovarian cancer.   SOCIAL HISTORY:  Occupation:  Futures trader.  The patient is married.  She has a  history of being a one pack-per-day smoker for 10 years.  She stopped  smoking in 1982.  Alcohol:  None.   REVIEW OF SYSTEMS:  Noncontributory except as noted above.  Denies headache,  visual changes, chest pain, shortness of breath, abdominal pain, change in  bowel habits, unintentional weight loss, dysuria, urgency, frequency,  vaginal pruritus or discharge, pain or bleeding with  intercourse.   PHYSICAL EXAMINATION:  VITAL SIGNS:  Blood pressure 134/90, height 5 feet 4  inches, weight 131.  HEENT:  Normal.  NECK:  Supple without thyromegaly, adenopathy, or nodules.  CHEST:  Clear to auscultation.  BREASTS:  Symmetrical without masses, nodes, nipple retraction, or nipple  discharge.  CARDIAC:  Regular rate and rhythm without extra sounds or murmurs.  ABDOMEN:  Soft, nontender.  No hepatosplenomegaly or masses.  EXTREMITIES:  No cyanosis, clubbing, or edema.  NEUROLOGIC:  Oriented x3.  Grossly normal.  PELVIC:  Normal external female genitalia.  No vulvar, vaginal, or cervical  lesions.  Her last Pap performed March 18, 2003 was within normal limits,  done by Dr. Cheryle Horsfall.  RECTAL:  Excellent sphincter tone, confirms pelvic exam.  No masses  palpated.   ASSESSMENT AND PLAN:  The patient is a 60 year old Caucasian female with  focal simple hyperplasia admitted for a D&C hysteroscopy to rule out higher  grade existing lesion.  I understand that she does not have the habitus  suggestive of higher grade coexisting lesion.  However, she has been  complaining of irregular cycles for over two years now.  She saw me and then  was upset that the office did not call her right back, saw Dr. Eda Paschal,  and most recently has seen Dr. Cheryle Horsfall.  She does understand after the  surgery she must follow up.  All of her questions were answered and we will  proceed with D&C and hysteroscopy to rule out a higher grade coexisting  hyperplasia.                                               Artist Pais, M.D.    DC/MEDQ  D:  09/15/2003  T:  09/15/2003  Job:  [id number]   cc:   Day [facility] at [facility]

## 2011-05-12 NOTE — Op Note (Signed)
NAME:  Denise Young, Denise Young                         ACCOUNT NO.:  [id number]   MEDICAL RECORD NO.:  [id number]                   PATIENT TYPE:  AMB   LOCATION:  SDC                                  FACILITY:  [facility]   PHYSICIAN:  Artist Pais, M.D.                 DATE OF BIRTH:  11-15-1951   DATE OF PROCEDURE:  09/16/2003  DATE OF DISCHARGE:                                 OPERATIVE REPORT   PREOPERATIVE DIAGNOSIS:  Simple hyperplasia without atypia.   POSTOPERATIVE DIAGNOSIS:  Simple hyperplasia without atypia, awaiting  pathology to rule out higher grade hyperplasia.   PROCEDURES:  1. Dilatation and curettage.  2. Hysteroscopy.   SURGEON:  Artist Pais, M.D.   ANESTHESIA:  General  by mask plus 20 mL of 1% lidocaine paracervical block.   FLUIDS:  1,300 mL of crystalloid plus 1 gram of ampicillin IV for SBE  prophylaxis.   DRAINS:  None.   COMPLICATIONS:  None.   FINDINGS:  Very thin endometrial lining.   DESCRIPTION OF OPERATION:  The patient was brought to the operating room,  identified on the operating room table.  After induction initially of  adequate sedation with monitored anesthesia care, the patient was placed in  the dorsal lithotomy position.  However, with insertion of the speculum she  began to close her knees and was obviously uncomfortable.  She required  increasing doses of Diprivan and thus she was given general anesthesia by  mask for comfort.  The area was prepped and draped in the usual sterile  fashion, the bladder was straight cathed for approximately 200 mL of clear  yellow urine.  An examination under anesthesia revealed the uterus to be  anteverted, mobile and nontender, normal size.  The speculum was placed and  the anterior lip of the cervix was grasped with a single-tooth tenaculum  after infiltrating the anterior portion of the cervix with 1 mL of 1%  lidocaine.  The remainder of 19 mL were placed for a paracervical block.  The cervix was then  very gently and carefully dilated up to a #25 Pratt  dilator.  The uterus sounded to approximately 7 cm.  The [facility] hysteroscope  using sorbitol as a distending medium was placed and a careful hysteroscopic  examination was performed.  I was not able to advance the scope farther than  the lower uterine segment due to the curve that the cervix made with the  uterus.  Although I was able to easily dilate her very carefully up to a #25  Pratt dilator, I was unable to advance the scope all the way to the fundus.  It was the surgeon's judgment that to forcefully advance the scope could  result in uterine perforation and subsequent complication and thus, because  I was able to see a good portion of the uterus, I decided not to risk the  chance of complication.  A  polyp was noted as well.  Subsequently the scope  was withdrawn and a curettage using the small serrated curette was performed  in a systematic clockwise fashion with tissue obtained.  The Randall stone  forceps were placed and additional tissue was obtained.  There was, however,  noted to be minimal tissue.  The endometrium was curetted until a good cry  was heard all around.  Subsequently the [facility] hysteroscope was again placed.  The uterus was noted to be well sampled and the polyp which had been  identified was noted to have been removed.  At that point the tenaculum was  removed.  There was noted to be no bleeding at the tenaculum site and the  procedure was then terminated.  The patient tolerated the procedure well  without apparent complications and was transferred to the recovery room in  stable condition after all sponge, instrument, and needle counts were  correct.  She was given a postop discharge instruction sheet, urged to call  for any problems.  She received Toradol in the operating room and is urged  to use aspirin or ibuprofen as needed for pain.  She is to refrain from  anything in her vagina until her postop  visit.                                               Artist Pais, M.D.    DC/MEDQ  D:  09/16/2003  T:  09/17/2003  Job:  [id number]

## 2011-06-28 ENCOUNTER — Emergency Department (HOSPITAL_COMMUNITY): Payer: 59

## 2011-06-28 ENCOUNTER — Emergency Department (HOSPITAL_COMMUNITY)
Admission: EM | Admit: 2011-06-28 | Discharge: 2011-06-28 | Disposition: A | Discharge status: Home / Self Care | Payer: 59 | Attending: Emergency Medicine | Admitting: Emergency Medicine

## 2011-06-28 DIAGNOSIS — R0989 Other specified symptoms and signs involving the circulatory and respiratory systems: Secondary | ICD-10-CM | POA: Insufficient documentation

## 2011-06-28 DIAGNOSIS — R079 Chest pain, unspecified: Secondary | ICD-10-CM | POA: Insufficient documentation

## 2011-06-28 DIAGNOSIS — R002 Palpitations: Secondary | ICD-10-CM | POA: Insufficient documentation

## 2011-06-28 DIAGNOSIS — R0609 Other forms of dyspnea: Secondary | ICD-10-CM | POA: Insufficient documentation

## 2011-06-28 LAB — URINALYSIS, ROUTINE W REFLEX MICROSCOPIC
Bilirubin Urine: NEGATIVE
Glucose, UA: NEGATIVE mg/dL
Hgb urine dipstick: NEGATIVE
Ketones, ur: NEGATIVE mg/dL
Leukocytes, UA: NEGATIVE
Nitrite: NEGATIVE
Protein, ur: NEGATIVE mg/dL
Specific Gravity, Urine: 1.011 (ref 1.005–1.030)
Urobilinogen, UA: 0.2 mg/dL (ref 0.0–1.0)
pH: 7.5 (ref 5.0–8.0)

## 2011-06-28 LAB — BASIC METABOLIC PANEL
BUN: 23 mg/dL (ref 6–23)
CO2: 26 mEq/L (ref 19–32)
Calcium: 9.4 mg/dL (ref 8.4–10.5)
Chloride: 105 mEq/L (ref 96–112)
Creatinine, Ser: 0.53 mg/dL (ref 0.50–1.10)
GFR calc Af Amer: >60 mL/min (ref >60)
GFR calc non Af Amer: >60 mL/min (ref >60)
Glucose, Bld: 106 mg/dL — ABNORMAL HIGH (ref 70–99)
Potassium: 3.6 mEq/L (ref 3.5–5.1)
Sodium: 141 mEq/L (ref 135–145)

## 2011-06-28 LAB — RAPID URINE DRUG SCREEN, HOSP PERFORMED
Amphetamines: NOT DETECTED
Barbiturates: NOT DETECTED
Benzodiazepines: NOT DETECTED
Cocaine: NOT DETECTED
Opiates: NOT DETECTED
Tetrahydrocannabinol: NOT DETECTED

## 2011-06-28 LAB — DIFFERENTIAL
Basophils Absolute: 0 10*3/uL (ref 0.0–0.1)
Basophils Relative: 0 % (ref 0–1)
Eosinophils Absolute: 0 10*3/uL (ref 0.0–0.7)
Eosinophils Relative: 0 % (ref 0–5)
Lymphocytes Relative: 24 % (ref 12–46)
Lymphs Abs: 1.5 10*3/uL (ref 0.7–4.0)
Monocytes Absolute: 0.3 10*3/uL (ref 0.1–1.0)
Monocytes Relative: 5 % (ref 3–12)
Neutro Abs: 4.2 10*3/uL (ref 1.7–7.7)
Neutrophils Relative %: 70 % (ref 43–77)

## 2011-06-28 LAB — CBC
HCT: 39 % (ref 36.0–46.0)
Hemoglobin: 14.1 g/dL (ref 12.0–15.0)
MCH: 30.5 pg (ref 26.0–34.0)
MCHC: 36.2 g/dL — ABNORMAL HIGH (ref 30.0–36.0)
MCV: 84.2 fL (ref 78.0–100.0)
Platelets: 183 10*3/uL (ref 150–400)
RBC: 4.63 MIL/uL (ref 3.87–5.11)
RDW: 12.3 % (ref 11.5–15.5)
WBC: 6 10*3/uL (ref 4.0–10.5)

## 2011-06-28 LAB — TROPONIN I: Troponin I: <0.3 ng/mL (ref <0.30)

## 2011-06-28 LAB — D-DIMER, QUANTITATIVE: D-Dimer, Quant: 0.34 ug/mL-FEU (ref 0.00–0.48)

## 2011-06-28 LAB — TSH: TSH: 2.145 u[IU]/mL (ref 0.350–4.500)

## 2012-03-15 ENCOUNTER — Other Ambulatory Visit: Payer: Self-pay | Admitting: Internal Medicine

## 2012-03-15 DIAGNOSIS — IMO0002 Reserved for concepts with insufficient information to code with codable children: Secondary | ICD-10-CM

## 2012-03-20 ENCOUNTER — Inpatient Hospital Stay: Admission: RE | Admit: 2012-03-20 | Payer: 59 | Source: Ambulatory Visit

## 2012-03-27 ENCOUNTER — Other Ambulatory Visit: Payer: 59

## 2012-03-27 ENCOUNTER — Ambulatory Visit
Admission: RE | Admit: 2012-03-27 | Discharge: 2012-03-27 | Disposition: A | Discharge status: Home / Self Care | Payer: PRIVATE HEALTH INSURANCE | Source: Ambulatory Visit | Attending: Internal Medicine | Admitting: Internal Medicine

## 2012-03-27 DIAGNOSIS — IMO0002 Reserved for concepts with insufficient information to code with codable children: Secondary | ICD-10-CM

## 2012-03-27 MED ORDER — GADOBENATE DIMEGLUMINE 529 MG/ML IV SOLN
10.0000 mL | Freq: Once | INTRAVENOUS | Status: AC | PRN
  Administered 2012-03-27: 10 mL via INTRAVENOUS

## 2012-10-29 ENCOUNTER — Other Ambulatory Visit: Payer: Self-pay | Admitting: *Deleted

## 2012-10-29 DIAGNOSIS — R748 Abnormal levels of other serum enzymes: Secondary | ICD-10-CM

## 2012-11-01 ENCOUNTER — Ambulatory Visit
Admission: RE | Admit: 2012-11-01 | Discharge: 2012-11-01 | Disposition: A | Discharge status: Home / Self Care | Payer: PRIVATE HEALTH INSURANCE | Source: Ambulatory Visit | Attending: *Deleted | Admitting: *Deleted

## 2012-11-01 DIAGNOSIS — R748 Abnormal levels of other serum enzymes: Secondary | ICD-10-CM

## 2013-02-07 ENCOUNTER — Encounter: Payer: Self-pay | Admitting: Obstetrics & Gynecology

## 2013-02-12 ENCOUNTER — Encounter: Payer: Self-pay | Admitting: Obstetrics & Gynecology

## 2013-03-20 ENCOUNTER — Other Ambulatory Visit: Payer: Self-pay | Admitting: Obstetrics & Gynecology

## 2013-03-20 ENCOUNTER — Ambulatory Visit (INDEPENDENT_AMBULATORY_CARE_PROVIDER_SITE_OTHER): Payer: PRIVATE HEALTH INSURANCE | Admitting: Obstetrics & Gynecology

## 2013-03-20 ENCOUNTER — Encounter: Payer: Self-pay | Admitting: Obstetrics & Gynecology

## 2013-03-20 VITALS — BP 150/82 | Ht 63.75 in | Wt 123.8 lb

## 2013-03-20 DIAGNOSIS — Z0000 Encounter for general adult medical examination without abnormal findings: Secondary | ICD-10-CM

## 2013-03-20 DIAGNOSIS — N952 Postmenopausal atrophic vaginitis: Secondary | ICD-10-CM

## 2013-03-20 DIAGNOSIS — E041 Nontoxic single thyroid nodule: Secondary | ICD-10-CM

## 2013-03-20 HISTORY — DX: Postmenopausal atrophic vaginitis: N95.2

## 2013-03-20 LAB — POCT URINALYSIS DIPSTICK
Bilirubin, UA: NEGATIVE
Blood, UA: NEGATIVE
Glucose, UA: NEGATIVE
Ketones, UA: NEGATIVE
Leukocytes, UA: NEGATIVE
Nitrite, UA: NEGATIVE
Protein, UA: NEGATIVE
Urobilinogen, UA: NEGATIVE
pH, UA: 5

## 2013-03-20 MED ORDER — ESTRADIOL 10 MCG VA TABS: 1.0000 | ORAL_TABLET | VAGINAL | Status: DC

## 2013-03-20 NOTE — Patient Instructions (Signed)

## 2013-03-20 NOTE — Progress Notes (Deleted)
62 y.o. G1P1 SingleCaucasianF here for annual exam.    Patient's last menstrual period was 08/25/2004.          Sexually active: no  The current method of family planning is post menopausal status.    Exercising: yes  walking Smoker:  no  Health Maintenance: Pap:  02/26/12 WNL/negative HRHPV MMG:  11/08/12 3D normal Colonoscopy:  3/09 scheduled 03/25/13 BMD:   02/20/12 TDaP:  ***   reports that she has never smoked. She has never used smokeless tobacco.  Past Medical History  Diagnosis Date  . THYROID NODULE 10/04/2010  . Other anxiety states 10/26/2010  . SINUSITIS - ACUTE-NOS 11/28/2010  . RHINITIS 11/23/2010  . IBS 10/26/2010  . FIBROMYALGIA 10/04/2010  . SLEEP DISORDER/DISTURBANCE 10/04/2010  . PALPITATIONS, RECURRENT 10/26/2010  . ELEVATED BP READING WITHOUT DX HYPERTENSION 10/04/2010  . Osteopenia     Past Surgical History  Procedure Laterality Date  . Cesarean section  12/26/1987  . Breast surgery  12/25/1990 and 12/25/1992    2 Done on Rt breast  . Temporomandibular joint surgery  12/25/1993  . Dilation and curettage of uterus  2004    Current Outpatient Prescriptions  Medication Sig Dispense Refill  . Cholecalciferol (VITAMIN D PO) Take by mouth.      . diazepam (VALIUM) 5 MG tablet Take 5 mg by mouth every 6 (six) hours as needed. Muscle spasms       . MAGNESIUM PO Take by mouth.      . propranolol (INDERAL) 10 MG tablet Take 10 mg by mouth. Take as needed      . polyethylene glycol-electrolytes (NULYTELY/GOLYTELY) 420 G solution Use prior to colonoscopy       No current facility-administered medications for this visit.    Family History  Problem Relation Age of Onset  . Hypertension Mother     Mild  . Hypothyroidism Mother   . Heart disease Father   . Migraines Sister   . Hypertension Sister   . Hypothyroidism Brother   . Cancer Brother     Cancer of neck  . Hypothyroidism Sister   . Hypertension Brother   . Diabetes Brother   . Heart disease Brother      Open Heart Disease  . Hyperlipidemia Brother   . Hypertension Brother   . Stroke Father   . Cancer Maternal Grandfather     pancreatic    ROS:  Pertinent items are noted in HPI.  Otherwise, a comprehensive ROS was negative.  Exam:   BP 150/82  Ht 5' 3.75" (1.619 m)  Wt 123 lb 12.8 oz (56.155 kg)  BMI 21.42 kg/m2  LMP 08/25/2004  Weight change: @WEIGHTCHANGE @ Height:   Height: 5' 3.75" (161.9 cm)  Ht Readings from Last 3 Encounters:  03/20/13 5' 3.75" (1.619 m)  10/04/10 5' 3.75" (1.619 m)    General appearance: alert, cooperative and appears stated age Head: Normocephalic, without obvious abnormality, atraumatic Neck: no adenopathy, supple, symmetrical, trachea midline and thyroid not enlarged, symmetric, no tenderness/mass/nodules Lungs: clear to auscultation bilaterally Breasts: Inspection negative, No nipple retraction or dimpling, No nipple discharge or bleeding, No axillary or supraclavicular adenopathy, Normal to palpation without dominant masses Heart: regular rate and rhythm Abdomen: soft, non-tender; bowel sounds normal; no masses,  no organomegaly Extremities: extremities normal, atraumatic, no cyanosis or edema Skin: Skin color, texture, turgor normal. No rashes or lesions Lymph nodes: Cervical, supraclavicular, and axillary nodes normal. No abnormal inguinal nodes palpated Neurologic: Grossly normal   Pelvic: External  genitalia:  no lesions              Urethra:  normal appearing urethra with no masses, tenderness or lesions              Bartholins and Skenes: normal                 Vagina: normal appearing vagina with normal color and discharge, no lesions              Cervix: {exam; cervix:14595}              Pap taken: {yes no:314532} Bimanual Exam:  Uterus:  {exam; uterus:12215}              Adnexa: {exam; adnexa:12223}               Rectovaginal: Confirms               Anus:  normal sphincter tone, no lesions  A:  Well Woman with normal exam  P:    {plan; gyn:5269::"mammogram","pap smear","return annually or prn"}  An After Visit Summary was printed and given to the patient.

## 2013-03-20 NOTE — Progress Notes (Addendum)
62 y.o. G1P1 SingleCaucasianF here for annual exam.  Still struggling with vaginal atrophy and dyspareunia.  Did not end up trying the vagifem.  Reports has three friends/family members with cancer.  She is just scared.  Patient is worried she has fullness on right of neck.  H/O of thyroid nodule.  Patient's last menstrual period was 08/25/2004.          Sexually active: no because of pain The current method of family planning is post menopausal status.    Exercising: yes  Home exercise routine includes walking. Smoker:  no  Health Maintenance: Pap:  wnl with neg HR HPV 3/13 MMG:  11/13 3D Colonoscopy:  3/09, h/o high grade dysplasia in polyp, scheduled for 4/14 BMD:   2/13 t score -1.8 in hip TDaP:  unsure   reports that she has never smoked. She has never used smokeless tobacco.  Past Medical History  Diagnosis Date  . THYROID NODULE 10/04/2010  . Other anxiety states 10/26/2010  . SINUSITIS - ACUTE-NOS 11/28/2010  . RHINITIS 11/23/2010  . IBS 10/26/2010  . FIBROMYALGIA 10/04/2010  . SLEEP DISORDER/DISTURBANCE 10/04/2010  . PALPITATIONS, RECURRENT 10/26/2010  . ELEVATED BP READING WITHOUT DX HYPERTENSION 10/04/2010  . Osteopenia   . Atrophic vaginitis 03/20/2013    Past Surgical History  Procedure Laterality Date  . Cesarean section  12/26/1987  . Breast surgery  12/25/1990 and 12/25/1992    2 Done on Rt breast  . Temporomandibular joint surgery  12/25/1993  . Dilation and curettage of uterus  2004    Current Outpatient Prescriptions  Medication Sig Dispense Refill  . Cholecalciferol (VITAMIN D PO) Take by mouth.      . diazepam (VALIUM) 5 MG tablet Take 5 mg by mouth every 6 (six) hours as needed. Muscle spasms       . MAGNESIUM PO Take by mouth.      . propranolol (INDERAL) 10 MG tablet Take 10 mg by mouth. Take as needed      . polyethylene glycol-electrolytes (NULYTELY/GOLYTELY) 420 G solution Use prior to colonoscopy       No current facility-administered  medications for this visit.    Family History  Problem Relation Age of Onset  . Hypertension Mother     Mild  . Hypothyroidism Mother   . Heart disease Father   . Migraines Sister   . Hypertension Sister   . Hypothyroidism Brother   . Cancer Brother     Cancer of neck  . Hypothyroidism Sister   . Hypertension Brother   . Diabetes Brother   . Heart disease Brother     Open Heart Disease  . Hyperlipidemia Brother   . Hypertension Brother   . Stroke Father   . Cancer Maternal Grandfather     pancreatic    ROS:  Pertinent items are noted in HPI.  Otherwise, a comprehensive ROS was negative.  Exam:   BP 150/82  Ht 5' 3.75" (1.619 m)  Wt 123 lb 12.8 oz (56.155 kg)  BMI 21.42 kg/m2  LMP 08/25/2004  Weight change: @WEIGHTCHANGE @ Height:   Height: 5' 3.75" (161.9 cm)  Ht Readings from Last 3 Encounters:  03/20/13 5' 3.75" (1.619 m)  10/04/10 5' 3.75" (1.619 m)    General appearance: alert, cooperative and appears stated age Head: Normocephalic, without obvious abnormality, atraumatic Neck: no adenopathy, supple, symmetrical, trachea midline and thyroid not enlarged, symmetric, no tenderness/mass/nodules Lungs: clear to auscultation bilaterally Breasts: Inspection negative, No nipple retraction or dimpling, No nipple  discharge or bleeding, No axillary or supraclavicular adenopathy, Normal to palpation without dominant masses Heart: regular rate and rhythm Abdomen: soft, non-tender; bowel sounds normal; no masses,  no organomegaly Extremities: extremities normal, atraumatic, no cyanosis or edema Skin: Skin color, texture, turgor normal. No rashes or lesions Lymph nodes: Cervical, supraclavicular, and axillary nodes normal. No abnormal inguinal nodes palpated Neurologic: Grossly normal   Pelvic: External genitalia:  no lesions              Urethra:  normal appearing urethra with no masses, tenderness or lesions              Bartholins and Skenes: normal                  Vagina: normal appearing vagina with normal color and discharge except for atrophic changes              Cervix: no lesions              Pap taken: no Bimanual Exam:  Uterus:  normal size, contour, position, consistency, mobility, non-tender              Adnexa: no mass, fullness, tenderness               Rectovaginal: Confirms               Anus:  normal sphincter tone, no lesions  A:  Well Woman with normal exam  P:   mammogram counseled on menopause, adequate intake of calcium and vitamin D Will try vagifem.  pv twice weekly.  Samples given. Had labs done with Dr. Clent Ridges.  Needs new PCP because Dr. Clent Ridges has moved. return annually or prn  An After Visit Summary was printed and given to the patient.   Thyroid u/s obtained due to pt's concern about fullness in neck.  8mm solid nodule in left lobe noted.  Recommended repeat U/S 6-12 months.  Will plan to repeat 12 months.

## 2013-04-09 ENCOUNTER — Telehealth: Payer: Self-pay | Admitting: *Deleted

## 2013-04-09 NOTE — Telephone Encounter (Signed)
Appointment made at [facility] for 04/14/2013 @ 9:30 am. Patient called to notify of this but could not take call. Will call us back. sue

## 2013-04-09 NOTE — Telephone Encounter (Signed)
Patient was given date and time of scheduled appt. At [facility] for ultra souond of thyroid. Stated she could not go that day and no one had consulted her about what time was best for her. Patient given # to [facility] and reschedule appt. Marland Kitchen

## 2013-04-14 ENCOUNTER — Other Ambulatory Visit: Payer: PRIVATE HEALTH INSURANCE

## 2013-04-15 ENCOUNTER — Ambulatory Visit
Admission: RE | Admit: 2013-04-15 | Discharge: 2013-04-15 | Disposition: A | Discharge status: Home / Self Care | Payer: PRIVATE HEALTH INSURANCE | Source: Ambulatory Visit | Attending: Obstetrics & Gynecology | Admitting: Obstetrics & Gynecology

## 2013-04-15 DIAGNOSIS — E041 Nontoxic single thyroid nodule: Secondary | ICD-10-CM

## 2013-04-15 NOTE — Progress Notes (Signed)
Chart in book on desk

## 2013-04-18 ENCOUNTER — Telehealth: Payer: Self-pay | Admitting: Obstetrics & Gynecology

## 2013-04-18 NOTE — Telephone Encounter (Signed)
Patient returning [facility] call re: results/[place]

## 2013-10-22 ENCOUNTER — Telehealth: Payer: Self-pay | Admitting: Obstetrics & Gynecology

## 2013-10-22 NOTE — Telephone Encounter (Signed)
Spoke with patient. She was wondering if she needed to have a 3D mammogram again this year. I advised that if Dr. Hyacinth Meeker suggested 3D for her previously then she would reccommend to do 3D mammogram each time due to dense breast tissue. Patient is agreeable to plan. She is calling to schedule for November.   Routing to provider for final review. Patient agreeable to disposition. Will close encounter

## 2013-10-22 NOTE — Telephone Encounter (Signed)
pt  has some questions regarding a mammogram

## 2014-01-27 ENCOUNTER — Other Ambulatory Visit: Payer: Self-pay | Admitting: Dermatology

## 2014-02-27 ENCOUNTER — Telehealth: Payer: Self-pay | Admitting: Obstetrics & Gynecology

## 2014-02-27 NOTE — Telephone Encounter (Signed)
Denise Young recently had a bone density at [facility]. They called her and told her she has a diagnosis of osteoporosis. Denise Young is concerned about the diagnosis and wants Dr. Ammie Ferrier opinion before starting any treatment. The Denise Young does not have a copy of the results yet and hopes we can request them for her.

## 2014-03-02 NOTE — Telephone Encounter (Signed)
Spoke with pt who recently had a BMD at [facility] as part of her physical. She reports the nurse called her and told her she had osteoporosis and that the doctor recommended Fosamax. Pt does not want to take anything without Dr. Sabra Heck looking at her scores and discussing with Dr. Sabra Heck first, as she knows fosamax can cause GI upset. Pt is fine with waiting until her 03-31-14 Aex appt to discuss. In the meantime she will fax over her BMD results to [place] as soon as she gets them in the mail, which she has requested. Fax number given.

## 2014-03-02 NOTE — Telephone Encounter (Signed)
Can you help me keep an eye out for this BMD that is being faxed?  See remainder of note.

## 2014-03-10 NOTE — Telephone Encounter (Signed)
Dr Sabra Heck, I put this in your folder with a note, did you get it?

## 2014-03-12 ENCOUNTER — Other Ambulatory Visit: Payer: Self-pay | Admitting: Dermatology

## 2014-03-27 NOTE — Telephone Encounter (Signed)
Have BMD.  Pt has appt 4/7.  Encounter closed.

## 2014-03-30 ENCOUNTER — Telehealth: Payer: Self-pay | Admitting: Emergency Medicine

## 2014-03-30 NOTE — Telephone Encounter (Signed)
Calling to see if patient would like to move up her appointment to 4/7 to the morning. If not, okay.  Message left to return call to [place] at [phone].

## 2014-03-30 NOTE — Telephone Encounter (Signed)
Patient returned call. Agreeable to move appointment to first afternoon appointment at 1245.   Appointment changed. Thanks offered to patient for her flexibility.

## 2014-03-31 ENCOUNTER — Ambulatory Visit (INDEPENDENT_AMBULATORY_CARE_PROVIDER_SITE_OTHER): Payer: PRIVATE HEALTH INSURANCE | Admitting: Obstetrics & Gynecology

## 2014-03-31 ENCOUNTER — Encounter: Payer: Self-pay | Admitting: Obstetrics & Gynecology

## 2014-03-31 VITALS — BP 138/72 | HR 64 | Resp 16 | Ht 63.75 in | Wt 123.0 lb

## 2014-03-31 DIAGNOSIS — M810 Age-related osteoporosis without current pathological fracture: Secondary | ICD-10-CM

## 2014-03-31 DIAGNOSIS — Z124 Encounter for screening for malignant neoplasm of cervix: Secondary | ICD-10-CM

## 2014-03-31 DIAGNOSIS — Z01419 Encounter for gynecological examination (general) (routine) without abnormal findings: Secondary | ICD-10-CM

## 2014-03-31 NOTE — Progress Notes (Addendum)
63 y.o. G1P1 SingleCaucasianF here for annual exam.  No vaginal bleeding.  Doing well.  Wants to talk about her recent BMD.  Had this done with PCP.  Was called and told results were worse and to start on Fosamax.  Pt frustrated with this management.  Feels she needs to discuss further before just "starting".  BMD reviewed.  Had already obtained a copy.  Does have osteoporosis in spine.  Hips show mild osteopenia that has worsened over last two years.  Fracture risk discussed as well as FRAX scoring.  My concern is that she is young and this will worsen.  I feel she should consider treatment.  Options including bisphosphonates, Evista, and Prolia discussed.  Atypical fractures, osteonecrosis, GERD risks discussed regarding bisphosphates.  DVT/stroke/PE risk discussed regarding Evista.  Increased infection risk with Prolia discussed.  Follow up BMD in two years and optimizing calcium, VIt D and exercise discussed.  All questions answered.    Patient's last menstrual period was 08/25/2004.          Sexually active: no  The current method of family planning is none.    Exercising: yes  walking Smoker:  Former smoker-quit [place] Maintenance: Pap:  3/13 WNL/negative HR HPV History of abnormal Pap:  no MMG:  11/26/13 3D-normal Colonoscopy:  03/25/13-repeat in 5 years BMD:   02/25/14-osteoporosis in the spine TDaP:  PCP Screening Labs: PCP, Hb today: PCP, Urine today: PCP   reports that she has quit smoking. She has never used smokeless tobacco. She reports that she does not drink alcohol or use illicit drugs.  Past Medical History  Diagnosis Date  . THYROID NODULE 10/04/2010  . Other anxiety states 10/26/2010  . SINUSITIS - ACUTE-NOS 11/28/2010  . RHINITIS 11/23/2010  . IBS 10/26/2010  . FIBROMYALGIA 10/04/2010  . SLEEP DISORDER/DISTURBANCE 10/04/2010  . PALPITATIONS, RECURRENT 10/26/2010  . ELEVATED BP READING WITHOUT DX HYPERTENSION 10/04/2010  . Osteopenia   . Atrophic vaginitis 03/20/2013   . Osteoporosis     in the spine    Past Surgical History  Procedure Laterality Date  . Cesarean section  12/26/1987  . Breast surgery  12/25/1990 and 12/25/1992    2 Done on Rt breast  . Temporomandibular joint surgery  12/25/1993  . Dilation and curettage of uterus  2004    Current Outpatient Prescriptions  Medication Sig Dispense Refill  . Calcium-Magnesium-Vitamin D (CALCIUM 500 PO) Take 500 mg by mouth.      . Cholecalciferol (VITAMIN D PO) Take by mouth.      . diazepam (VALIUM) 5 MG tablet Take 5 mg by mouth every 6 (six) hours as needed. Muscle spasms       . MAGNESIUM PO Take by mouth.      . propranolol (INDERAL) 10 MG tablet Take 10 mg by mouth. Take as needed      . Estradiol 10 MCG TABS Place 1 tablet (10 mcg total) vaginally 2 (two) times a week.  18 tablet  0   No current facility-administered medications for this visit.    Family History  Problem Relation Age of Onset  . Hypertension Mother     Mild  . Hypothyroidism Mother   . Heart disease Father   . Migraines Sister   . Hypertension Sister   . Hypothyroidism Brother   . Cancer Brother     Cancer of neck  . Hypothyroidism Sister   . Hypertension Brother   . Diabetes Brother   . Heart disease  Brother     Open Heart Disease  . Hyperlipidemia Brother   . Hypertension Brother   . Stroke Father   . Cancer Maternal Grandfather     pancreatic  . Osteoporosis Sister     ROS:  Pertinent items are noted in HPI.  Otherwise, a comprehensive ROS was negative.  Exam:   BP 138/72  Pulse 64  Resp 16  Ht 5' 3.75" (1.619 m)  Wt 123 lb (55.792 kg)  BMI 21.29 kg/m2  LMP 08/25/2004  Weight change: +1#   Height: 5' 3.75" (161.9 cm)  Ht Readings from Last 3 Encounters:  03/31/14 5' 3.75" (1.619 m)  03/20/13 5' 3.75" (1.619 m)  10/04/10 5' 3.75" (1.619 m)    General appearance: alert, cooperative and appears stated age Head: Normocephalic, without obvious abnormality, atraumatic Neck: no adenopathy,  supple, symmetrical, trachea midline and thyroid normal to inspection and palpation Lungs: clear to auscultation bilaterally Breasts: normal appearance, no masses or tenderness Heart: regular rate and rhythm Abdomen: soft, non-tender; bowel sounds normal; no masses,  no organomegaly Extremities: extremities normal, atraumatic, no cyanosis or edema Skin: Skin color, texture, turgor normal. No rashes or lesions Lymph nodes: Cervical, supraclavicular, and axillary nodes normal. No abnormal inguinal nodes palpated Neurologic: Grossly normal   Pelvic: External genitalia:  no lesions              Urethra:  normal appearing urethra with no masses, tenderness or lesions              Bartholins and Skenes: normal                 Vagina: normal appearing vagina with normal color and discharge, no lesions              Cervix: no lesions              Pap taken: yes Bimanual Exam:  Uterus:  normal size, contour, position, consistency, mobility, non-tender              Adnexa: normal adnexa and no mass, fullness, tenderness               Rectovaginal: Confirms               Anus:  normal sphincter tone, no lesions  A:  Well Woman with normal exam Osteoporosis, needs to consider treatment PMP, no HRT Vaginal dryness, declines any hormonal therapy due to concerns (we discussed vagifem and other options last year.  She never tried the vagifem.)  P:   Mammogram yearly. pap smear only obtained. Will initiate Fosamax 70mg  weekly.  Pt knows to take on empty stomach in AM and be upright for 60 minutes before eating anything.  Will call with any side effects.  Rx given. Coconut oil discussed for vaginal symptoms. return annually or prn  In additional to AEX, about 20 minutes was spent in direct face to face discussion of BMD, treatment options, risks and benefits of medications, and follow up.  04/30/14.  1 year follow up u/s scheduled for pt.  She cancelled appt.  Documentation is in EPIC.   An After  Visit Summary was printed and given to the patient.

## 2014-03-31 NOTE — Patient Instructions (Signed)

## 2014-04-01 DIAGNOSIS — M810 Age-related osteoporosis without current pathological fracture: Secondary | ICD-10-CM | POA: Insufficient documentation

## 2014-04-02 LAB — IPS PAP SMEAR ONLY

## 2014-04-30 ENCOUNTER — Encounter: Payer: Self-pay | Admitting: Emergency Medicine

## 2014-04-30 ENCOUNTER — Other Ambulatory Visit: Payer: Self-pay | Admitting: Obstetrics & Gynecology

## 2014-04-30 DIAGNOSIS — E041 Nontoxic single thyroid nodule: Secondary | ICD-10-CM

## 2014-04-30 NOTE — Progress Notes (Signed)
Pt need thyroid u/s scheduled.  This is a one year follow-up.  Order placed.  Pt aware we will call and schedule.

## 2014-04-30 NOTE — Progress Notes (Signed)
Patient scheduled for Thyroid US at [facility] for 05/04/14 at 1245. [place] [address].   Message left to return call to [place] at [phone].   Advised will sent mychart message as well.

## 2014-05-01 NOTE — Progress Notes (Signed)
Documented in chart about this as well.  Encounter closed.

## 2014-05-01 NOTE — Progress Notes (Signed)
Patient returned call. She would like to wait until she has met her deductible to have the thyroid ultrasound. She understands she needs the ultrasound but that she would like to wait. She states "We've been watching this for 15 years now, a little more time will be okay".  I called and cancelled appointment with [facility] and will keep patient in imaging hold.

## 2014-05-04 ENCOUNTER — Other Ambulatory Visit: Payer: PRIVATE HEALTH INSURANCE

## 2014-05-20 ENCOUNTER — Ambulatory Visit (INDEPENDENT_AMBULATORY_CARE_PROVIDER_SITE_OTHER): Payer: PRIVATE HEALTH INSURANCE | Admitting: Podiatrist

## 2014-05-20 ENCOUNTER — Encounter: Payer: Self-pay | Admitting: Podiatrist

## 2014-05-20 VITALS — BP 129/82 | HR 82 | Resp 16 | Ht 64.0 in | Wt 122.0 lb

## 2014-05-20 DIAGNOSIS — L03039 Cellulitis of unspecified toe: Secondary | ICD-10-CM

## 2014-05-20 MED ORDER — CEPHALEXIN 500 MG PO CAPS: 500.0000 mg | ORAL_CAPSULE | Freq: Two times a day (BID) | ORAL | Status: DC

## 2014-05-20 NOTE — Patient Instructions (Addendum)
I will see you back in 2-3 weeks to recheck your toenail-- if it isn't improved with the antibiotic and with the soaks, likely we will have to remove the toenail and allow a new one to grow back.

## 2014-05-20 NOTE — Progress Notes (Signed)
   Chief Complaint  Patient presents with  . Ingrown Toenail    left great toenail      HPI: Patient is 63 y.o. female who presents today for a painful left great toenail.  She relates it is painful and has been oozy and bloody but has since improved some.       Physical Exam Patient is awake, alert, and oriented x 3.  In no acute distress.  Vascular status is intact with palpable pedal pulses at 2/4 DP and PT bilateral and capillary refill time within normal limits. Neurological sensation is also intact bilaterally via Semmes Weinstein monofilament at 5/5 sites. Light touch, vibratory sensation, Achilles tendon reflex is intact. Dermatological exam reveals skin color, turger and texture as normal. No open lesions present.  Musculature intact with dorsiflexion, plantarflexion, inversion, eversion.  Left great toenail appears to be mildly infected.  No malodor, no active drainage, no pus or bloody drainage is noted.  Mild redness along the proximal nail fold is seen .  The nail is growing out in sheets and thus is her main concern.    Assessment:  Paronychia left halux nail  Plan: recommended oral antibiotics and soaks.  i will see her back in 2-3 weeks and if there is no improvement will have to remove the nail and allow a new one to grow back in.

## 2014-06-03 ENCOUNTER — Ambulatory Visit (INDEPENDENT_AMBULATORY_CARE_PROVIDER_SITE_OTHER): Payer: PRIVATE HEALTH INSURANCE | Admitting: Podiatrist

## 2014-06-03 DIAGNOSIS — L03039 Cellulitis of unspecified toe: Secondary | ICD-10-CM

## 2014-06-03 MED ORDER — CEPHALEXIN 500 MG PO CAPS: 500.0000 mg | ORAL_CAPSULE | Freq: Two times a day (BID) | ORAL | Status: DC

## 2014-06-04 NOTE — Progress Notes (Signed)
Patient presents for recheck of left hallux nail.  Relates it has improved with antibiotic. She is concerned with the multiple layers of toenail that continue to grow and the slow growth of the toenail in comparison with the right hallux nail.  Objective: neurovascular status unchanged.  Left hallux proximal nail fold is much improved.  No redness or streaking noted.  Minimal gapping at the proximal medial and lateral corners of the nail at its attachment noted.  No drainage present.   Assessment:  Dystrophic hallux nail left/ resolving cellulitus left.    Plan:  Recommended watching the toenail and avoidance of any irritation to the nail in the future.  She is sensitive on this nail to any trauma or injury which causes a new layer of nail to form and will cause the nail to grow very slow.  i rx'd another round of antibiotic in case the redness and swelling return.  We talked about removal of the toenail to allow a new one to grow however with the time it takes a new nail to grow for her i cautoned against this procedure unless necessary.

## 2014-07-10 ENCOUNTER — Ambulatory Visit: Payer: PRIVATE HEALTH INSURANCE | Admitting: Obstetrics & Gynecology

## 2014-07-14 ENCOUNTER — Other Ambulatory Visit: Payer: Self-pay | Admitting: Dermatology

## 2014-08-06 ENCOUNTER — Encounter: Payer: Self-pay | Admitting: Podiatry

## 2014-08-06 ENCOUNTER — Ambulatory Visit (INDEPENDENT_AMBULATORY_CARE_PROVIDER_SITE_OTHER): Payer: PRIVATE HEALTH INSURANCE | Admitting: Podiatry

## 2014-08-06 VITALS — BP 118/68 | HR 77 | Resp 16

## 2014-08-06 DIAGNOSIS — L03039 Cellulitis of unspecified toe: Secondary | ICD-10-CM

## 2014-08-06 DIAGNOSIS — L608 Other nail disorders: Secondary | ICD-10-CM

## 2014-08-07 NOTE — Progress Notes (Signed)
She presents today with a chief complaint of a painful hallux nail left.  Objective: Pulses are palpable. Hallux nail left demonstrates an onychodystrophy with sharp incurvated margin.  Assessment: Onychodystrophy hallux left.  Plan: Discussed appropriate management of the toenail.

## 2014-10-26 ENCOUNTER — Encounter: Payer: Self-pay | Admitting: Podiatry

## 2014-11-02 ENCOUNTER — Telehealth: Payer: Self-pay | Admitting: Obstetrics & Gynecology

## 2014-11-02 NOTE — Telephone Encounter (Signed)
Patient requesting to speak with the nurse to confirm she still needs mammograms yearly.

## 2014-11-02 NOTE — Telephone Encounter (Signed)
Spoke with patient. Patient due to have mammogram in December. She wanted to know if Dr. Sabra Heck has any new recommendations for yearly screenings. Advised to continue with 3D screening mammograms as discussed with Dr. Sabra Heck at last annual exam in 03/2014.  Patient has had no new health changes. Advised would have Dr. Sabra Heck review and if other recommendation  from Dr. Sabra Heck would return her call. Patient agreeable.

## 2014-11-03 NOTE — Telephone Encounter (Signed)
Due to her breast density, I still feel she should have yearly MMG.  We will discuss the new [employer] recommendations at her AEX.  Thanks.  OK to close encounter once pt aware.

## 2014-11-03 NOTE — Telephone Encounter (Signed)
Patient given message from Dr. Sabra Heck and is agreeable to plan.  Will close encounter.

## 2014-11-03 NOTE — Telephone Encounter (Signed)
Message left to return call to Frazier Balfour at [phone].    

## 2014-12-10 ENCOUNTER — Other Ambulatory Visit: Payer: Self-pay | Admitting: Dermatology

## 2015-04-05 ENCOUNTER — Ambulatory Visit (INDEPENDENT_AMBULATORY_CARE_PROVIDER_SITE_OTHER): Payer: PRIVATE HEALTH INSURANCE | Admitting: Obstetrics & Gynecology

## 2015-04-05 ENCOUNTER — Encounter: Payer: Self-pay | Admitting: Obstetrics & Gynecology

## 2015-04-05 VITALS — BP 126/72 | HR 64 | Resp 16 | Ht 63.75 in | Wt 121.4 lb

## 2015-04-05 DIAGNOSIS — Z01419 Encounter for gynecological examination (general) (routine) without abnormal findings: Secondary | ICD-10-CM | POA: Diagnosis not present

## 2015-04-05 MED ORDER — ESTRADIOL 10 MCG VA TABS: ORAL_TABLET | VAGINAL | Status: DC

## 2015-04-05 NOTE — Progress Notes (Addendum)
64 y.o. G1P1 SingleCaucasianF here for annual exam.  Doing well.  Brought labs with her from Dr. Jackelyn Poling.  Saw him in February.  Denies vaginal bleeding.    Pt had new diagnosis of osteoporosis with BMD last year.  Pt did try Fosamax due to severe foot pain.  Took this for a few months and stopped after feet hurt with shoes on.  This improved over the past few months.  She reports the pain has now completely resolved.  Pt has decided to not take any medication at this time.  Pt keeping weight down.  She is exercising regularly and taking calcium and Vit D.  Husband diagnosed with melanoma last year.  This has been very stressful for pt due to the surgery he had to undergo.    Patient's last menstrual period was 08/25/2004.          Sexually active: No.  The current method of family planning is post menopausal status.    Exercising: Yes.    walking Smoker:  Former smoker years ago  Health Maintenance: Pap:  04/01/14 WNL History of abnormal Pap:  no MMG:  11/23/14 3D-normal Colonoscopy:  03/25/13-repeat in 5 years BMD:   02/25/14 TDaP:  Up to date with PCP Screening Labs: PCP, Hb today: PCP, Urine today: PCP   reports that she has quit smoking. She has never used smokeless tobacco. She reports that she does not drink alcohol or use illicit drugs.  Past Medical History  Diagnosis Date  . THYROID NODULE 10/04/2010  . Other anxiety states 10/26/2010  . SINUSITIS - ACUTE-NOS 11/28/2010  . RHINITIS 11/23/2010  . IBS 10/26/2010  . FIBROMYALGIA 10/04/2010  . SLEEP DISORDER/DISTURBANCE 10/04/2010  . PALPITATIONS, RECURRENT 10/26/2010  . ELEVATED BP READING WITHOUT DX HYPERTENSION 10/04/2010  . Osteopenia   . Atrophic vaginitis 03/20/2013  . Osteoporosis     in the spine    Past Surgical History  Procedure Laterality Date  . Cesarean section  12/26/1987  . Breast surgery  12/25/1990 and 12/25/1992    2 Done on Rt breast  . Temporomandibular joint surgery  12/25/1993  . Dilation and curettage  of uterus  2004    Current Outpatient Prescriptions  Medication Sig Dispense Refill  . Calcium-Magnesium-Vitamin D (CALCIUM 500 PO) Take 500 mg by mouth.    . Cholecalciferol (VITAMIN D PO) Take by mouth.    . diazepam (VALIUM) 5 MG tablet Take 5 mg by mouth every 6 (six) hours as needed. Muscle spasms     . MAGNESIUM PO Take by mouth.    . propranolol (INDERAL) 10 MG tablet Take 10 mg by mouth. Take as needed     No current facility-administered medications for this visit.    Family History  Problem Relation Age of Onset  . Hypertension Mother     Mild  . Hypothyroidism Mother   . Heart disease Father   . Migraines Sister   . Hypertension Sister   . Hypothyroidism Brother   . Cancer Brother     Cancer of neck  . Hypothyroidism Sister   . Hypertension Brother   . Diabetes Brother   . Heart disease Brother     Open Heart Disease  . Hyperlipidemia Brother   . Hypertension Brother   . Stroke Father   . Cancer Maternal Grandfather     pancreatic  . Osteoporosis Sister     hypercalciuria    ROS:  Pertinent items are noted in HPI.  Otherwise, a comprehensive  ROS was negative.  Exam:   BP 126/72 mmHg  Pulse 64  Resp 16  Ht 5' 3.75" (1.619 m)  Wt 121 lb 6.4 oz (55.067 kg)  BMI 21.01 kg/m2  LMP 08/25/2004  Weight change: -2#  Height: 5' 3.75" (161.9 cm)  Ht Readings from Last 3 Encounters:  04/05/15 5' 3.75" (1.619 m)  05/20/14 5\' 4"  (1.626 m)  03/31/14 5' 3.75" (1.619 m)    General appearance: alert, cooperative and appears stated age Head: Normocephalic, without obvious abnormality, atraumatic Neck: no adenopathy, supple, symmetrical, trachea midline and thyroid normal to inspection and palpation Lungs: clear to auscultation bilaterally Breasts: normal appearance, no masses or tenderness Heart: regular rate and rhythm Abdomen: soft, non-tender; bowel sounds normal; no masses,  no organomegaly Extremities: extremities normal, atraumatic, no cyanosis or  edema Skin: Skin color, texture, turgor normal. No rashes or lesions Lymph nodes: Cervical, supraclavicular, and axillary nodes normal. No abnormal inguinal nodes palpated Neurologic: Grossly normal   Pelvic: External genitalia:  no lesions              Urethra:  normal appearing urethra with no masses, tenderness or lesions              Bartholins and Skenes: normal                 Vagina: normal appearing vagina with normal color and discharge, no lesions              Cervix: no lesions              Pap taken: No. Bimanual Exam:  Uterus:  normal size, contour, position, consistency, mobility, non-tender              Adnexa: normal adnexa and no mass, fullness, tenderness               Rectovaginal: Confirms               Anus:  normal sphincter tone, no lesions  Chaperone was present for exam.  A:  Well Woman with normal exam Osteoporosis, needs to consider treatment PMP, no HRT Vaginal dryness and dyspareunia.. Fibromyalgia Thyroid nodule, 6mm nodule.  Sees Dr. Elyse Hsu.  Last imaging 4/14.  recommended follow up 6-12 months.  He did not recommend repeat PUS.  P: Mammogram yearly. pap smear 2015.  No pap today.   Failed Fosamax use.  She is not interested in trying anything else at this time.  Will repeat BMP with Dr. Jackelyn Poling. Labs/vaccines with PCP. Pt considering Vagifem use.  Rx given to pt.   return annually or prn

## 2015-04-05 NOTE — Addendum Note (Signed)
Addended by: Megan Salon on: 04/05/2015 01:59 PM   Modules accepted: Orders, SmartSet

## 2016-04-10 ENCOUNTER — Encounter: Payer: Self-pay | Admitting: Obstetrics & Gynecology

## 2016-04-10 ENCOUNTER — Ambulatory Visit (INDEPENDENT_AMBULATORY_CARE_PROVIDER_SITE_OTHER): Payer: PRIVATE HEALTH INSURANCE | Admitting: Obstetrics & Gynecology

## 2016-04-10 VITALS — BP 140/76 | HR 84 | Resp 14 | Ht 63.5 in | Wt 123.0 lb

## 2016-04-10 DIAGNOSIS — Z01419 Encounter for gynecological examination (general) (routine) without abnormal findings: Secondary | ICD-10-CM

## 2016-04-10 DIAGNOSIS — Z205 Contact with and (suspected) exposure to viral hepatitis: Secondary | ICD-10-CM

## 2016-04-10 DIAGNOSIS — Z124 Encounter for screening for malignant neoplasm of cervix: Secondary | ICD-10-CM

## 2016-04-10 MED ORDER — ESTRADIOL 10 MCG VA TABS: ORAL_TABLET | VAGINAL | Status: DC

## 2016-04-10 NOTE — Addendum Note (Signed)
Addended by: Megan Salon on: 04/10/2016 03:27 PM   Modules accepted: Orders, SmartSet

## 2016-04-10 NOTE — Progress Notes (Addendum)
65 y.o. G1P1 SingleCaucasianF here for annual exam.  Doing well.  Denies vaginal bleeding.    Pt has hx of osteopenia.  Declines treatment.    Still having vaginal dryness.  Didn't try the vagifem last year.  Would like to try it this year.  PCP:  Dr. Jackelyn Poling.  Last appt was in Feb.  Lab work was done then.  Reports lipids were mildly elevated.    Patient's last menstrual period was 08/25/2004.          Sexually active: No.  The current method of family planning is post menopausal status.    Exercising: Yes.    Walking daily Smoker:  no  Health Maintenance: Pap:  04/01/14 Neg History of abnormal Pap:  no MMG:  11/25/15 BIRADS1:neg Colonoscopy:  03/25/2013 Repeat 5 years  BMD:   02/25/14 w/ PCP.  TDaP:  W/PCP Hep C:  Will do testing today. Screening Labs: PCP, Hb today: PCP, Urine today: PCP   reports that she has quit smoking. She has never used smokeless tobacco. She reports that she does not drink alcohol or use illicit drugs.  Past Medical History  Diagnosis Date  . THYROID NODULE 10/04/2010  . Other anxiety states 10/26/2010  . SINUSITIS - ACUTE-NOS 11/28/2010  . RHINITIS 11/23/2010  . IBS 10/26/2010  . FIBROMYALGIA 10/04/2010  . SLEEP DISORDER/DISTURBANCE 10/04/2010  . PALPITATIONS, RECURRENT 10/26/2010  . ELEVATED BP READING WITHOUT DX HYPERTENSION 10/04/2010  . Osteopenia   . Atrophic vaginitis 03/20/2013  . Osteoporosis     in the spine    Past Surgical History  Procedure Laterality Date  . Cesarean section  12/26/1987  . Breast surgery  12/25/1990 and 12/25/1992    2 Done on Rt breast  . Temporomandibular joint surgery  12/25/1993  . Dilation and curettage of uterus  2004    Current Outpatient Prescriptions  Medication Sig Dispense Refill  . Cholecalciferol (VITAMIN D PO) Take by mouth.    . diazepam (VALIUM) 5 MG tablet Take 5 mg by mouth every 6 (six) hours as needed. Muscle spasms     . MAGNESIUM PO Take by mouth.    . propranolol (INDERAL) 10 MG tablet Take  10 mg by mouth. Take as needed     No current facility-administered medications for this visit.    Family History  Problem Relation Age of Onset  . Hypertension Mother     Mild  . Hypothyroidism Mother   . Heart disease Father   . Migraines Sister   . Hypertension Sister   . Hypothyroidism Brother   . Cancer Brother     Cancer of neck  . Hypothyroidism Sister   . Hypertension Brother   . Diabetes Brother   . Heart disease Brother     Open Heart Disease  . Hyperlipidemia Brother   . Hypertension Brother   . Stroke Father   . Cancer Maternal Grandfather     pancreatic  . Osteoporosis Sister     hypercalciuria    ROS:  Pertinent items are noted in HPI.  Otherwise, a comprehensive ROS was negative.  Exam:   BP 140/76 mmHg  Pulse 84  Resp 14  Ht 5' 3.5" (1.613 m)  Wt 123 lb (55.792 kg)  BMI 21.44 kg/m2  LMP 08/25/2004  Weight change:  -2#Height: 5' 3.5" (161.3 cm)  Ht Readings from Last 3 Encounters:  04/10/16 5' 3.5" (1.613 m)  04/05/15 5' 3.75" (1.619 m)  05/20/14 5\' 4"  (1.626 m)  General appearance: alert, cooperative and appears stated age Head: Normocephalic, without obvious abnormality, atraumatic Neck: no adenopathy, supple, symmetrical, trachea midline and thyroid normal to inspection and palpation Lungs: clear to auscultation bilaterally Breasts: normal appearance, no masses or tenderness Heart: regular rate and rhythm Abdomen: soft, non-tender; bowel sounds normal; no masses,  no organomegaly Extremities: extremities normal, atraumatic, no cyanosis or edema Skin: Skin color, texture, turgor normal. No rashes or lesions Lymph nodes: Cervical, supraclavicular, and axillary nodes normal. No abnormal inguinal nodes palpated Neurologic: Grossly normal   Pelvic: External genitalia:  no lesions              Urethra:  normal appearing urethra with no masses, tenderness or lesions              Bartholins and Skenes: normal                 Vagina: normal  appearing vagina with normal color and discharge, no lesions              Cervix: no lesions              Pap taken: Yes.   Bimanual Exam:  Uterus:  normal size, contour, position, consistency, mobility, non-tender              Adnexa: normal adnexa and no mass, fullness, tenderness               Rectovaginal: Confirms               Anus:  normal sphincter tone, no lesions  Chaperone was present for exam.  A:  Well Woman with normal exam Osteoporosis, needs to consider treatment PMP, no HRT Vaginal dryness and dyspareunia Fibromyalgia Thyroid nodule, 45mm nodule. Sees Dr. Elyse Hsu. Last imaging 4/14. recommended follow up 6-12 months. He did not recommend repeat PUS.  P: Mammogram yearly. pap smear 2015. No pap today.  Failed Fosamax use. She is not interested in trying anything else at this time. Declines BMD for this year. Labs/vaccines with PCP. Pt considering Vagifem use. Rx given to pt. Vagifem 53meq pv 1 pv nightly for 14 days, then twice weekly.  Rx given for entire year.  return annually or prn

## 2016-04-11 LAB — HEPATITIS C ANTIBODY: HCV Ab: NEGATIVE

## 2016-04-12 LAB — IPS PAP TEST WITH HPV

## 2016-04-21 ENCOUNTER — Ambulatory Visit: Payer: PRIVATE HEALTH INSURANCE | Admitting: Obstetrics & Gynecology

## 2016-04-27 ENCOUNTER — Telehealth: Payer: Self-pay | Admitting: Obstetrics & Gynecology

## 2016-04-27 NOTE — Telephone Encounter (Signed)
Patient dropped off a BMD report last week for Dr.Miller to review. Patient is asking if she will need to schedule an appointment to discuss the results.

## 2016-04-27 NOTE — Telephone Encounter (Signed)
Routing to Dr Miller for review.  

## 2016-05-09 NOTE — Telephone Encounter (Signed)
Left detailed message for pt.  Feel she can continue to watch BMD and not proceed with treatment.  Asked her to return call if has additional questions.  Ok to close encounter.

## 2016-05-10 ENCOUNTER — Telehealth: Payer: Self-pay | Admitting: Obstetrics & Gynecology

## 2016-05-10 NOTE — Telephone Encounter (Signed)
I've recently made an appt with Billey Gosling at [facility] on [address].  I have not seen her yet but I was recommended to see her from a family physician that I trust.  Hope that helps.

## 2016-05-10 NOTE — Telephone Encounter (Signed)
Patient says she's returning Dr. Ammie Ferrier call from yesterday.

## 2016-05-10 NOTE — Telephone Encounter (Signed)
Patient notified of Dr Ammie Ferrier recommendation.  Encounter closed.

## 2016-05-10 NOTE — Telephone Encounter (Signed)
Denise Salon, MD at 05/09/2016 5:44 PM     Status: Signed       Expand All Collapse All   Left detailed message for pt. Feel she can continue to watch BMD and not proceed with treatment. Asked her to return call if has additional questions. Ok to close encounter.       Spoke with patient. Advised of message as seen above from [name]. She is agreeable and verbalizes understanding. Patient is asking for a recommendation from [name] for a female PCP. States she is seen with [name] and would like to change locations. Would like Dr.Miller's personal recommendation for her. Advised I will speak with Dr.Miller upon her return to the office tomorrow and return call with additional recommendations. She is agreeable.

## 2016-09-21 DIAGNOSIS — L409 Psoriasis, unspecified: Secondary | ICD-10-CM | POA: Insufficient documentation

## 2016-11-28 DIAGNOSIS — I10 Essential (primary) hypertension: Secondary | ICD-10-CM | POA: Insufficient documentation

## 2016-12-07 ENCOUNTER — Encounter: Payer: Self-pay | Admitting: Obstetrics & Gynecology

## 2017-01-25 DIAGNOSIS — L400 Psoriasis vulgaris: Secondary | ICD-10-CM | POA: Diagnosis not present

## 2017-01-29 DIAGNOSIS — L400 Psoriasis vulgaris: Secondary | ICD-10-CM | POA: Diagnosis not present

## 2017-02-01 DIAGNOSIS — L400 Psoriasis vulgaris: Secondary | ICD-10-CM | POA: Diagnosis not present

## 2017-02-05 DIAGNOSIS — L400 Psoriasis vulgaris: Secondary | ICD-10-CM | POA: Diagnosis not present

## 2017-02-08 DIAGNOSIS — L400 Psoriasis vulgaris: Secondary | ICD-10-CM | POA: Diagnosis not present

## 2017-02-12 DIAGNOSIS — L400 Psoriasis vulgaris: Secondary | ICD-10-CM | POA: Diagnosis not present

## 2017-02-15 DIAGNOSIS — L400 Psoriasis vulgaris: Secondary | ICD-10-CM | POA: Diagnosis not present

## 2017-02-19 DIAGNOSIS — L400 Psoriasis vulgaris: Secondary | ICD-10-CM | POA: Diagnosis not present

## 2017-02-26 DIAGNOSIS — L400 Psoriasis vulgaris: Secondary | ICD-10-CM | POA: Diagnosis not present

## 2017-03-02 DIAGNOSIS — L400 Psoriasis vulgaris: Secondary | ICD-10-CM | POA: Diagnosis not present

## 2017-03-05 DIAGNOSIS — L400 Psoriasis vulgaris: Secondary | ICD-10-CM | POA: Diagnosis not present

## 2017-03-08 DIAGNOSIS — L400 Psoriasis vulgaris: Secondary | ICD-10-CM | POA: Diagnosis not present

## 2017-03-12 DIAGNOSIS — L400 Psoriasis vulgaris: Secondary | ICD-10-CM | POA: Diagnosis not present

## 2017-03-13 DIAGNOSIS — M792 Neuralgia and neuritis, unspecified: Secondary | ICD-10-CM | POA: Diagnosis not present

## 2017-03-13 DIAGNOSIS — E041 Nontoxic single thyroid nodule: Secondary | ICD-10-CM | POA: Diagnosis not present

## 2017-03-13 DIAGNOSIS — E784 Other hyperlipidemia: Secondary | ICD-10-CM | POA: Diagnosis not present

## 2017-03-13 DIAGNOSIS — I10 Essential (primary) hypertension: Secondary | ICD-10-CM | POA: Diagnosis not present

## 2017-03-13 DIAGNOSIS — R8299 Other abnormal findings in urine: Secondary | ICD-10-CM | POA: Diagnosis not present

## 2017-03-13 DIAGNOSIS — E559 Vitamin D deficiency, unspecified: Secondary | ICD-10-CM | POA: Diagnosis not present

## 2017-03-15 DIAGNOSIS — L400 Psoriasis vulgaris: Secondary | ICD-10-CM | POA: Diagnosis not present

## 2017-03-16 DIAGNOSIS — Z1212 Encounter for screening for malignant neoplasm of rectum: Secondary | ICD-10-CM | POA: Diagnosis not present

## 2017-03-19 DIAGNOSIS — L400 Psoriasis vulgaris: Secondary | ICD-10-CM | POA: Diagnosis not present

## 2017-03-20 DIAGNOSIS — M810 Age-related osteoporosis without current pathological fracture: Secondary | ICD-10-CM | POA: Diagnosis not present

## 2017-03-20 DIAGNOSIS — I10 Essential (primary) hypertension: Secondary | ICD-10-CM | POA: Diagnosis not present

## 2017-03-20 DIAGNOSIS — E041 Nontoxic single thyroid nodule: Secondary | ICD-10-CM | POA: Diagnosis not present

## 2017-03-20 DIAGNOSIS — Z1389 Encounter for screening for other disorder: Secondary | ICD-10-CM | POA: Diagnosis not present

## 2017-03-20 DIAGNOSIS — Z6821 Body mass index (BMI) 21.0-21.9, adult: Secondary | ICD-10-CM | POA: Diagnosis not present

## 2017-03-20 DIAGNOSIS — M797 Fibromyalgia: Secondary | ICD-10-CM | POA: Diagnosis not present

## 2017-03-20 DIAGNOSIS — Z0000 Encounter for general adult medical examination without abnormal findings: Secondary | ICD-10-CM | POA: Diagnosis not present

## 2017-03-20 DIAGNOSIS — G4709 Other insomnia: Secondary | ICD-10-CM | POA: Diagnosis not present

## 2017-03-20 DIAGNOSIS — R0681 Apnea, not elsewhere classified: Secondary | ICD-10-CM | POA: Diagnosis not present

## 2017-03-20 DIAGNOSIS — L408 Other psoriasis: Secondary | ICD-10-CM | POA: Diagnosis not present

## 2017-03-22 DIAGNOSIS — L400 Psoriasis vulgaris: Secondary | ICD-10-CM | POA: Diagnosis not present

## 2017-03-26 DIAGNOSIS — L400 Psoriasis vulgaris: Secondary | ICD-10-CM | POA: Diagnosis not present

## 2017-04-02 DIAGNOSIS — L400 Psoriasis vulgaris: Secondary | ICD-10-CM | POA: Diagnosis not present

## 2017-04-05 DIAGNOSIS — L400 Psoriasis vulgaris: Secondary | ICD-10-CM | POA: Diagnosis not present

## 2017-04-09 DIAGNOSIS — L400 Psoriasis vulgaris: Secondary | ICD-10-CM | POA: Diagnosis not present

## 2017-04-11 DIAGNOSIS — L400 Psoriasis vulgaris: Secondary | ICD-10-CM | POA: Diagnosis not present

## 2017-04-11 DIAGNOSIS — L720 Epidermal cyst: Secondary | ICD-10-CM | POA: Diagnosis not present

## 2017-04-12 DIAGNOSIS — H6123 Impacted cerumen, bilateral: Secondary | ICD-10-CM | POA: Diagnosis not present

## 2017-04-12 DIAGNOSIS — L400 Psoriasis vulgaris: Secondary | ICD-10-CM | POA: Diagnosis not present

## 2017-04-16 DIAGNOSIS — L400 Psoriasis vulgaris: Secondary | ICD-10-CM | POA: Diagnosis not present

## 2017-04-23 DIAGNOSIS — L400 Psoriasis vulgaris: Secondary | ICD-10-CM | POA: Diagnosis not present

## 2017-04-27 DIAGNOSIS — L400 Psoriasis vulgaris: Secondary | ICD-10-CM | POA: Diagnosis not present

## 2017-04-30 ENCOUNTER — Ambulatory Visit (INDEPENDENT_AMBULATORY_CARE_PROVIDER_SITE_OTHER): Payer: Medicare Other | Admitting: Obstetrics & Gynecology

## 2017-04-30 ENCOUNTER — Encounter: Payer: Self-pay | Admitting: Obstetrics & Gynecology

## 2017-04-30 VITALS — BP 138/80 | HR 66 | Resp 16 | Ht 63.5 in | Wt 124.0 lb

## 2017-04-30 DIAGNOSIS — R7989 Other specified abnormal findings of blood chemistry: Secondary | ICD-10-CM

## 2017-04-30 DIAGNOSIS — Z01419 Encounter for gynecological examination (general) (routine) without abnormal findings: Secondary | ICD-10-CM

## 2017-04-30 DIAGNOSIS — R3129 Other microscopic hematuria: Secondary | ICD-10-CM | POA: Diagnosis not present

## 2017-04-30 DIAGNOSIS — R946 Abnormal results of thyroid function studies: Secondary | ICD-10-CM | POA: Diagnosis not present

## 2017-04-30 LAB — TSH: TSH: 1.35 mIU/L

## 2017-04-30 MED ORDER — ESTRADIOL 10 MCG VA TABS: ORAL_TABLET | VAGINAL | 13 refills | Status: DC

## 2017-04-30 NOTE — Progress Notes (Signed)
66 y.o. G1P1 Married WF here for annual exam.  Doing well.  No vaginal bleeding.  Brought outside labs with her.  Had blood in her urine.  No symptoms of urinary tract issues.   Having symptoms of always feeling like she needs to have a BM.  She reports this feels like rectal pressure.  She has noted more "pellet like" bowel movements.    Patient's last menstrual period was 08/25/2004.          Sexually active: No.  The current method of family planning is post menopausal status.    Exercising: Yes.    Walking Smoker:  no  Health Maintenance: Pap:  04/10/16 Neg. HR HPV:neg   04/01/14 Neg   History of abnormal Pap:  no MMG:  11/29/16 BIRADS2:Benign  Colonoscopy:  03/25/13 f/u 5 years  BMD:   04/19/16 Osteopenia, -2.8 spine, hips with osteopenia TDaP:  PCP Pneumonia vaccine(s):  No Zostavax: contraindicated.  Pt allergic to neomycin.  She and I discussed Shingrix vaccination today.   Hep C testing: 04/10/16 Neg  Screening Labs: PCP   reports that she has quit smoking. She has never used smokeless tobacco. She reports that she does not drink alcohol or use drugs.  Past Medical History:  Diagnosis Date  . Atrophic vaginitis 03/20/2013  . ELEVATED BP READING WITHOUT DX HYPERTENSION 10/04/2010  . FIBROMYALGIA 10/04/2010  . IBS 10/26/2010  . Osteopenia   . Osteoporosis    in the spine  . Other anxiety states 10/26/2010  . PALPITATIONS, RECURRENT 10/26/2010  . RHINITIS 11/23/2010  . SINUSITIS - ACUTE-NOS 11/28/2010  . SLEEP DISORDER/DISTURBANCE 10/04/2010  . THYROID NODULE 10/04/2010    Past Surgical History:  Procedure Laterality Date  . BREAST SURGERY  12/25/1990 and 12/25/1992   2 Done on Rt breast  . CESAREAN SECTION  12/26/1987  . DILATION AND CURETTAGE OF UTERUS  2004  . TEMPOROMANDIBULAR JOINT SURGERY  12/25/1993    Current Outpatient Prescriptions  Medication Sig Dispense Refill  . Calcium Carbonate-Vitamin D (CALCIUM 500 + D) 500-125 MG-UNIT TABS daily.    . Cholecalciferol  (VITAMIN D PO) Take by mouth.    . diazepam (VALIUM) 5 MG tablet Take 5 mg by mouth every 6 (six) hours as needed. Muscle spasms     . Folate-B12-Intrinsic Factor (INTRINSI B12-FOLATE) 016-010-93 MCG-MCG-MG TABS Place under the tongue daily.    . Lactobacillus Rhamnosus, GG, (CULTURELLE) CAPS Take 1 capsule by mouth daily.    Marland Kitchen MAGNESIUM PO Take 1 tablet by mouth daily.    . mupirocin ointment (BACTROBAN) 2 % daily as needed.  2  . propranolol (INDERAL) 10 MG tablet Take 10 mg by mouth. Take as needed     No current facility-administered medications for this visit.     Family History  Problem Relation Age of Onset  . Heart disease Father   . Stroke Father   . Hypertension Mother     Mild  . Hypothyroidism Mother   . Migraines Sister   . Hypertension Sister   . Hypothyroidism Brother   . Cancer Brother     Cancer of neck  . Hypothyroidism Sister   . Hypertension Brother   . Diabetes Brother   . Heart disease Brother     Open Heart Disease  . Hyperlipidemia Brother   . Hypertension Brother   . Cancer Maternal Grandfather     pancreatic  . Osteoporosis Sister     hypercalciuria    ROS:  Pertinent items are  noted in HPI.  Otherwise, a comprehensive ROS was negative.  Exam:   BP 138/80 (BP Location: Left Arm, Patient Position: Sitting, Cuff Size: Normal)   Pulse 66   Resp 16   Ht 5' 3.5" (1.613 m)   Wt 124 lb (56.2 kg)   LMP 08/25/2004   BMI 21.62 kg/m   Weight change: +1#   Height: 5' 3.5" (161.3 cm)  Ht Readings from Last 3 Encounters:  04/30/17 5' 3.5" (1.613 m)  04/10/16 5' 3.5" (1.613 m)  04/05/15 5' 3.75" (1.619 m)    General appearance: alert, cooperative and appears stated age Head: Normocephalic, without obvious abnormality, atraumatic Neck: no adenopathy, supple, symmetrical, trachea midline and thyroid normal to inspection and palpation Lungs: clear to auscultation bilaterally Breasts: normal appearance, no masses or tenderness Heart: regular rate and  rhythm Abdomen: soft, non-tender; bowel sounds normal; no masses,  no organomegaly Extremities: extremities normal, atraumatic, no cyanosis or edema Skin: Skin color, texture, turgor normal. No rashes or lesions Lymph nodes: Cervical, supraclavicular, and axillary nodes normal. No abnormal inguinal nodes palpated Neurologic: Grossly normal   Pelvic: External genitalia:  no lesions              Urethra:  normal appearing urethra with no masses, tenderness or lesions              Bartholins and Skenes: normal                 Vagina: normal appearing vagina with normal color and discharge, no lesions              Cervix: no lesions              Pap taken: No. Bimanual Exam:  Uterus:  normal size, contour, position, consistency, mobility, non-tender              Adnexa: normal adnexa and no mass, fullness, tenderness               Rectovaginal: Confirms               Anus:  normal sphincter tone, no lesions  Chaperone was present for exam.  A:  Well Woman with normal exam PMP, no HRT Osteoporosis Fibromyalgia H/o thyroid nodule Microscopic hematuria Abnormal TSH on screening lab work  P:   Mammogram guidelines pap smear neg 2017.  No pap obtained today Urine micro obtained today Rx for Vagifem 62meq pv x 14 days, decreased two twice weekly.  Rx to pharmacy. TSH obtained today Return annually or prn

## 2017-05-01 LAB — URINALYSIS, MICROSCOPIC ONLY
Bacteria, UA: NONE SEEN [HPF]
Casts: NONE SEEN [LPF]
Crystals: NONE SEEN [HPF]
RBC / HPF: NONE SEEN RBC/HPF (ref <=2)
Squamous Epithelial / LPF: NONE SEEN [HPF] (ref <=5)
WBC, UA: NONE SEEN WBC/HPF (ref <=5)
Yeast: NONE SEEN [HPF]

## 2017-05-02 DIAGNOSIS — L400 Psoriasis vulgaris: Secondary | ICD-10-CM | POA: Diagnosis not present

## 2017-05-07 DIAGNOSIS — L400 Psoriasis vulgaris: Secondary | ICD-10-CM | POA: Diagnosis not present

## 2017-05-10 DIAGNOSIS — L400 Psoriasis vulgaris: Secondary | ICD-10-CM | POA: Diagnosis not present

## 2017-05-18 DIAGNOSIS — L400 Psoriasis vulgaris: Secondary | ICD-10-CM | POA: Diagnosis not present

## 2017-05-23 DIAGNOSIS — L400 Psoriasis vulgaris: Secondary | ICD-10-CM | POA: Diagnosis not present

## 2017-05-28 DIAGNOSIS — R198 Other specified symptoms and signs involving the digestive system and abdomen: Secondary | ICD-10-CM | POA: Diagnosis not present

## 2017-05-28 DIAGNOSIS — L400 Psoriasis vulgaris: Secondary | ICD-10-CM | POA: Diagnosis not present

## 2017-05-31 DIAGNOSIS — L400 Psoriasis vulgaris: Secondary | ICD-10-CM | POA: Diagnosis not present

## 2017-06-04 DIAGNOSIS — L400 Psoriasis vulgaris: Secondary | ICD-10-CM | POA: Diagnosis not present

## 2017-06-07 DIAGNOSIS — L400 Psoriasis vulgaris: Secondary | ICD-10-CM | POA: Diagnosis not present

## 2017-06-11 DIAGNOSIS — L400 Psoriasis vulgaris: Secondary | ICD-10-CM | POA: Diagnosis not present

## 2017-06-15 DIAGNOSIS — L400 Psoriasis vulgaris: Secondary | ICD-10-CM | POA: Diagnosis not present

## 2017-06-18 DIAGNOSIS — L400 Psoriasis vulgaris: Secondary | ICD-10-CM | POA: Diagnosis not present

## 2017-06-21 DIAGNOSIS — L400 Psoriasis vulgaris: Secondary | ICD-10-CM | POA: Diagnosis not present

## 2017-06-25 DIAGNOSIS — L400 Psoriasis vulgaris: Secondary | ICD-10-CM | POA: Diagnosis not present

## 2017-06-29 DIAGNOSIS — L400 Psoriasis vulgaris: Secondary | ICD-10-CM | POA: Diagnosis not present

## 2017-07-05 DIAGNOSIS — L400 Psoriasis vulgaris: Secondary | ICD-10-CM | POA: Diagnosis not present

## 2017-07-09 DIAGNOSIS — L400 Psoriasis vulgaris: Secondary | ICD-10-CM | POA: Diagnosis not present

## 2017-07-12 DIAGNOSIS — L400 Psoriasis vulgaris: Secondary | ICD-10-CM | POA: Diagnosis not present

## 2017-07-16 DIAGNOSIS — L400 Psoriasis vulgaris: Secondary | ICD-10-CM | POA: Diagnosis not present

## 2017-07-18 DIAGNOSIS — M25571 Pain in right ankle and joints of right foot: Secondary | ICD-10-CM | POA: Diagnosis not present

## 2017-07-18 DIAGNOSIS — S96811A Strain of other specified muscles and tendons at ankle and foot level, right foot, initial encounter: Secondary | ICD-10-CM | POA: Diagnosis not present

## 2017-07-19 DIAGNOSIS — L400 Psoriasis vulgaris: Secondary | ICD-10-CM | POA: Diagnosis not present

## 2017-07-20 ENCOUNTER — Ambulatory Visit: Payer: PRIVATE HEALTH INSURANCE | Admitting: Obstetrics & Gynecology

## 2017-07-23 DIAGNOSIS — L400 Psoriasis vulgaris: Secondary | ICD-10-CM | POA: Diagnosis not present

## 2017-07-24 ENCOUNTER — Ambulatory Visit: Payer: Medicare Other | Admitting: Podiatry

## 2017-07-26 DIAGNOSIS — L400 Psoriasis vulgaris: Secondary | ICD-10-CM | POA: Diagnosis not present

## 2017-07-30 DIAGNOSIS — L400 Psoriasis vulgaris: Secondary | ICD-10-CM | POA: Diagnosis not present

## 2017-07-30 DIAGNOSIS — H6123 Impacted cerumen, bilateral: Secondary | ICD-10-CM | POA: Diagnosis not present

## 2017-08-06 DIAGNOSIS — L400 Psoriasis vulgaris: Secondary | ICD-10-CM | POA: Diagnosis not present

## 2017-08-10 DIAGNOSIS — L400 Psoriasis vulgaris: Secondary | ICD-10-CM | POA: Diagnosis not present

## 2017-08-10 DIAGNOSIS — R198 Other specified symptoms and signs involving the digestive system and abdomen: Secondary | ICD-10-CM | POA: Diagnosis not present

## 2017-08-14 DIAGNOSIS — L400 Psoriasis vulgaris: Secondary | ICD-10-CM | POA: Diagnosis not present

## 2017-08-15 DIAGNOSIS — Z6821 Body mass index (BMI) 21.0-21.9, adult: Secondary | ICD-10-CM | POA: Diagnosis not present

## 2017-08-15 DIAGNOSIS — R5383 Other fatigue: Secondary | ICD-10-CM | POA: Diagnosis not present

## 2017-08-15 DIAGNOSIS — G4709 Other insomnia: Secondary | ICD-10-CM | POA: Diagnosis not present

## 2017-08-15 DIAGNOSIS — I10 Essential (primary) hypertension: Secondary | ICD-10-CM | POA: Diagnosis not present

## 2017-08-20 DIAGNOSIS — L400 Psoriasis vulgaris: Secondary | ICD-10-CM | POA: Diagnosis not present

## 2017-08-23 DIAGNOSIS — L400 Psoriasis vulgaris: Secondary | ICD-10-CM | POA: Diagnosis not present

## 2017-08-28 DIAGNOSIS — L400 Psoriasis vulgaris: Secondary | ICD-10-CM | POA: Diagnosis not present

## 2017-08-31 DIAGNOSIS — L400 Psoriasis vulgaris: Secondary | ICD-10-CM | POA: Diagnosis not present

## 2017-09-03 DIAGNOSIS — I10 Essential (primary) hypertension: Secondary | ICD-10-CM | POA: Diagnosis not present

## 2017-09-04 DIAGNOSIS — L400 Psoriasis vulgaris: Secondary | ICD-10-CM | POA: Diagnosis not present

## 2017-09-07 DIAGNOSIS — L400 Psoriasis vulgaris: Secondary | ICD-10-CM | POA: Diagnosis not present

## 2017-09-14 DIAGNOSIS — R198 Other specified symptoms and signs involving the digestive system and abdomen: Secondary | ICD-10-CM | POA: Diagnosis not present

## 2017-09-14 DIAGNOSIS — Z86010 Personal history of colonic polyps: Secondary | ICD-10-CM | POA: Diagnosis not present

## 2017-09-14 DIAGNOSIS — L400 Psoriasis vulgaris: Secondary | ICD-10-CM | POA: Diagnosis not present

## 2017-09-19 DIAGNOSIS — L400 Psoriasis vulgaris: Secondary | ICD-10-CM | POA: Diagnosis not present

## 2017-09-25 DIAGNOSIS — L400 Psoriasis vulgaris: Secondary | ICD-10-CM | POA: Diagnosis not present

## 2017-09-28 DIAGNOSIS — L400 Psoriasis vulgaris: Secondary | ICD-10-CM | POA: Diagnosis not present

## 2017-10-01 DIAGNOSIS — M797 Fibromyalgia: Secondary | ICD-10-CM | POA: Diagnosis not present

## 2017-10-01 DIAGNOSIS — L400 Psoriasis vulgaris: Secondary | ICD-10-CM | POA: Diagnosis not present

## 2017-10-01 DIAGNOSIS — G4721 Circadian rhythm sleep disorder, delayed sleep phase type: Secondary | ICD-10-CM | POA: Diagnosis not present

## 2017-10-08 DIAGNOSIS — I10 Essential (primary) hypertension: Secondary | ICD-10-CM | POA: Diagnosis not present

## 2017-10-08 DIAGNOSIS — Z23 Encounter for immunization: Secondary | ICD-10-CM | POA: Diagnosis not present

## 2017-10-08 DIAGNOSIS — Z6821 Body mass index (BMI) 21.0-21.9, adult: Secondary | ICD-10-CM | POA: Diagnosis not present

## 2017-10-08 DIAGNOSIS — L400 Psoriasis vulgaris: Secondary | ICD-10-CM | POA: Diagnosis not present

## 2017-10-10 DIAGNOSIS — D485 Neoplasm of uncertain behavior of skin: Secondary | ICD-10-CM | POA: Diagnosis not present

## 2017-10-10 DIAGNOSIS — B070 Plantar wart: Secondary | ICD-10-CM | POA: Diagnosis not present

## 2017-10-10 DIAGNOSIS — D2271 Melanocytic nevi of right lower limb, including hip: Secondary | ICD-10-CM | POA: Diagnosis not present

## 2017-10-10 DIAGNOSIS — D2261 Melanocytic nevi of right upper limb, including shoulder: Secondary | ICD-10-CM | POA: Diagnosis not present

## 2017-10-10 DIAGNOSIS — L821 Other seborrheic keratosis: Secondary | ICD-10-CM | POA: Diagnosis not present

## 2017-10-10 DIAGNOSIS — L400 Psoriasis vulgaris: Secondary | ICD-10-CM | POA: Diagnosis not present

## 2017-10-10 DIAGNOSIS — L820 Inflamed seborrheic keratosis: Secondary | ICD-10-CM | POA: Diagnosis not present

## 2017-10-10 DIAGNOSIS — D2262 Melanocytic nevi of left upper limb, including shoulder: Secondary | ICD-10-CM | POA: Diagnosis not present

## 2017-10-10 DIAGNOSIS — L718 Other rosacea: Secondary | ICD-10-CM | POA: Diagnosis not present

## 2017-10-17 DIAGNOSIS — L400 Psoriasis vulgaris: Secondary | ICD-10-CM | POA: Diagnosis not present

## 2017-10-24 DIAGNOSIS — L400 Psoriasis vulgaris: Secondary | ICD-10-CM | POA: Diagnosis not present

## 2017-10-29 DIAGNOSIS — L400 Psoriasis vulgaris: Secondary | ICD-10-CM | POA: Diagnosis not present

## 2017-11-06 DIAGNOSIS — L400 Psoriasis vulgaris: Secondary | ICD-10-CM | POA: Diagnosis not present

## 2017-11-09 DIAGNOSIS — G4709 Other insomnia: Secondary | ICD-10-CM | POA: Diagnosis not present

## 2017-11-09 DIAGNOSIS — Z6821 Body mass index (BMI) 21.0-21.9, adult: Secondary | ICD-10-CM | POA: Diagnosis not present

## 2017-11-09 DIAGNOSIS — I10 Essential (primary) hypertension: Secondary | ICD-10-CM | POA: Diagnosis not present

## 2017-11-12 DIAGNOSIS — H6123 Impacted cerumen, bilateral: Secondary | ICD-10-CM | POA: Diagnosis not present

## 2017-11-13 DIAGNOSIS — L400 Psoriasis vulgaris: Secondary | ICD-10-CM | POA: Diagnosis not present

## 2017-11-19 DIAGNOSIS — G4721 Circadian rhythm sleep disorder, delayed sleep phase type: Secondary | ICD-10-CM | POA: Diagnosis not present

## 2017-11-20 DIAGNOSIS — L400 Psoriasis vulgaris: Secondary | ICD-10-CM | POA: Diagnosis not present

## 2017-11-27 DIAGNOSIS — L400 Psoriasis vulgaris: Secondary | ICD-10-CM | POA: Diagnosis not present

## 2017-11-28 DIAGNOSIS — H16223 Keratoconjunctivitis sicca, not specified as Sjogren's, bilateral: Secondary | ICD-10-CM | POA: Diagnosis not present

## 2017-12-04 DIAGNOSIS — L400 Psoriasis vulgaris: Secondary | ICD-10-CM | POA: Diagnosis not present

## 2017-12-05 DIAGNOSIS — Z1231 Encounter for screening mammogram for malignant neoplasm of breast: Secondary | ICD-10-CM | POA: Diagnosis not present

## 2017-12-08 DIAGNOSIS — L820 Inflamed seborrheic keratosis: Secondary | ICD-10-CM | POA: Diagnosis not present

## 2017-12-08 DIAGNOSIS — L905 Scar conditions and fibrosis of skin: Secondary | ICD-10-CM | POA: Diagnosis not present

## 2017-12-10 DIAGNOSIS — L400 Psoriasis vulgaris: Secondary | ICD-10-CM | POA: Diagnosis not present

## 2017-12-13 DIAGNOSIS — L400 Psoriasis vulgaris: Secondary | ICD-10-CM | POA: Diagnosis not present

## 2017-12-19 DIAGNOSIS — L400 Psoriasis vulgaris: Secondary | ICD-10-CM | POA: Diagnosis not present

## 2017-12-27 DIAGNOSIS — L400 Psoriasis vulgaris: Secondary | ICD-10-CM | POA: Diagnosis not present

## 2018-01-02 DIAGNOSIS — I10 Essential (primary) hypertension: Secondary | ICD-10-CM | POA: Diagnosis not present

## 2018-01-03 DIAGNOSIS — L400 Psoriasis vulgaris: Secondary | ICD-10-CM | POA: Diagnosis not present

## 2018-01-10 DIAGNOSIS — L400 Psoriasis vulgaris: Secondary | ICD-10-CM | POA: Diagnosis not present

## 2018-01-17 DIAGNOSIS — L400 Psoriasis vulgaris: Secondary | ICD-10-CM | POA: Diagnosis not present

## 2018-01-24 DIAGNOSIS — Z6821 Body mass index (BMI) 21.0-21.9, adult: Secondary | ICD-10-CM | POA: Diagnosis not present

## 2018-01-24 DIAGNOSIS — K219 Gastro-esophageal reflux disease without esophagitis: Secondary | ICD-10-CM | POA: Diagnosis not present

## 2018-01-25 DIAGNOSIS — L400 Psoriasis vulgaris: Secondary | ICD-10-CM | POA: Diagnosis not present

## 2018-01-31 DIAGNOSIS — L820 Inflamed seborrheic keratosis: Secondary | ICD-10-CM | POA: Diagnosis not present

## 2018-01-31 DIAGNOSIS — D485 Neoplasm of uncertain behavior of skin: Secondary | ICD-10-CM | POA: Diagnosis not present

## 2018-02-01 DIAGNOSIS — L400 Psoriasis vulgaris: Secondary | ICD-10-CM | POA: Diagnosis not present

## 2018-02-07 DIAGNOSIS — L400 Psoriasis vulgaris: Secondary | ICD-10-CM | POA: Diagnosis not present

## 2018-02-14 DIAGNOSIS — L400 Psoriasis vulgaris: Secondary | ICD-10-CM | POA: Diagnosis not present

## 2018-02-18 DIAGNOSIS — H40013 Open angle with borderline findings, low risk, bilateral: Secondary | ICD-10-CM | POA: Diagnosis not present

## 2018-02-21 DIAGNOSIS — L400 Psoriasis vulgaris: Secondary | ICD-10-CM | POA: Diagnosis not present

## 2018-02-26 DIAGNOSIS — H6123 Impacted cerumen, bilateral: Secondary | ICD-10-CM | POA: Diagnosis not present

## 2018-02-27 DIAGNOSIS — L400 Psoriasis vulgaris: Secondary | ICD-10-CM | POA: Diagnosis not present

## 2018-03-07 DIAGNOSIS — L400 Psoriasis vulgaris: Secondary | ICD-10-CM | POA: Diagnosis not present

## 2018-03-13 DIAGNOSIS — L400 Psoriasis vulgaris: Secondary | ICD-10-CM | POA: Diagnosis not present

## 2018-03-18 DIAGNOSIS — R82998 Other abnormal findings in urine: Secondary | ICD-10-CM | POA: Diagnosis not present

## 2018-03-18 DIAGNOSIS — E785 Hyperlipidemia, unspecified: Secondary | ICD-10-CM | POA: Diagnosis not present

## 2018-03-18 DIAGNOSIS — M810 Age-related osteoporosis without current pathological fracture: Secondary | ICD-10-CM | POA: Diagnosis not present

## 2018-03-18 DIAGNOSIS — E041 Nontoxic single thyroid nodule: Secondary | ICD-10-CM | POA: Diagnosis not present

## 2018-03-18 DIAGNOSIS — I10 Essential (primary) hypertension: Secondary | ICD-10-CM | POA: Diagnosis not present

## 2018-03-18 DIAGNOSIS — E559 Vitamin D deficiency, unspecified: Secondary | ICD-10-CM | POA: Diagnosis not present

## 2018-03-20 DIAGNOSIS — L400 Psoriasis vulgaris: Secondary | ICD-10-CM | POA: Diagnosis not present

## 2018-03-25 DIAGNOSIS — R5383 Other fatigue: Secondary | ICD-10-CM | POA: Diagnosis not present

## 2018-03-25 DIAGNOSIS — M7989 Other specified soft tissue disorders: Secondary | ICD-10-CM | POA: Diagnosis not present

## 2018-03-25 DIAGNOSIS — I10 Essential (primary) hypertension: Secondary | ICD-10-CM | POA: Diagnosis not present

## 2018-03-25 DIAGNOSIS — Z0000 Encounter for general adult medical examination without abnormal findings: Secondary | ICD-10-CM | POA: Diagnosis not present

## 2018-03-25 DIAGNOSIS — E559 Vitamin D deficiency, unspecified: Secondary | ICD-10-CM | POA: Diagnosis not present

## 2018-03-25 DIAGNOSIS — G4700 Insomnia, unspecified: Secondary | ICD-10-CM | POA: Diagnosis not present

## 2018-03-25 DIAGNOSIS — M810 Age-related osteoporosis without current pathological fracture: Secondary | ICD-10-CM | POA: Diagnosis not present

## 2018-03-25 DIAGNOSIS — Z8249 Family history of ischemic heart disease and other diseases of the circulatory system: Secondary | ICD-10-CM | POA: Diagnosis not present

## 2018-03-25 DIAGNOSIS — E041 Nontoxic single thyroid nodule: Secondary | ICD-10-CM | POA: Diagnosis not present

## 2018-03-25 DIAGNOSIS — K219 Gastro-esophageal reflux disease without esophagitis: Secondary | ICD-10-CM | POA: Diagnosis not present

## 2018-03-25 DIAGNOSIS — Z6821 Body mass index (BMI) 21.0-21.9, adult: Secondary | ICD-10-CM | POA: Diagnosis not present

## 2018-03-28 DIAGNOSIS — L400 Psoriasis vulgaris: Secondary | ICD-10-CM | POA: Diagnosis not present

## 2018-04-04 DIAGNOSIS — L400 Psoriasis vulgaris: Secondary | ICD-10-CM | POA: Diagnosis not present

## 2018-04-10 ENCOUNTER — Telehealth: Payer: Self-pay | Admitting: Obstetrics & Gynecology

## 2018-04-10 DIAGNOSIS — L400 Psoriasis vulgaris: Secondary | ICD-10-CM | POA: Diagnosis not present

## 2018-04-10 NOTE — Telephone Encounter (Signed)
Patient called and stated that she is having "uncomfortable atrophic vaginitis symptoms." Stated that it is not urgent but would like to speak with someone before the holiday weekend.

## 2018-04-10 NOTE — Telephone Encounter (Signed)
Spoke with patient. Patient states that her labia is tender. Denies any redness, swelling, or discharge. Feels this is due to dryness and atrophy. Asking what she can use for relief OTF for these symptoms. Advised may use coconut oil vaginally as needed for symptoms. Advised if symptoms worsen or do not improve with use of coconut oil will need to be seen for further evaluation. Patient is agreeable.   Routing to provider for final review. Patient agreeable to disposition. Will close encounter.

## 2018-04-11 DIAGNOSIS — Z86010 Personal history of colonic polyps: Secondary | ICD-10-CM | POA: Diagnosis not present

## 2018-04-11 DIAGNOSIS — K5901 Slow transit constipation: Secondary | ICD-10-CM | POA: Diagnosis not present

## 2018-04-19 DIAGNOSIS — L400 Psoriasis vulgaris: Secondary | ICD-10-CM | POA: Diagnosis not present

## 2018-04-25 DIAGNOSIS — L400 Psoriasis vulgaris: Secondary | ICD-10-CM | POA: Diagnosis not present

## 2018-04-30 DIAGNOSIS — L400 Psoriasis vulgaris: Secondary | ICD-10-CM | POA: Diagnosis not present

## 2018-05-03 DIAGNOSIS — Z86010 Personal history of colonic polyps: Secondary | ICD-10-CM | POA: Diagnosis not present

## 2018-05-08 DIAGNOSIS — L400 Psoriasis vulgaris: Secondary | ICD-10-CM | POA: Diagnosis not present

## 2018-05-15 DIAGNOSIS — L400 Psoriasis vulgaris: Secondary | ICD-10-CM | POA: Diagnosis not present

## 2018-05-22 DIAGNOSIS — L400 Psoriasis vulgaris: Secondary | ICD-10-CM | POA: Diagnosis not present

## 2018-05-27 DIAGNOSIS — I10 Essential (primary) hypertension: Secondary | ICD-10-CM | POA: Diagnosis not present

## 2018-05-27 DIAGNOSIS — G479 Sleep disorder, unspecified: Secondary | ICD-10-CM | POA: Diagnosis not present

## 2018-05-30 DIAGNOSIS — L400 Psoriasis vulgaris: Secondary | ICD-10-CM | POA: Diagnosis not present

## 2018-05-30 DIAGNOSIS — L718 Other rosacea: Secondary | ICD-10-CM | POA: Diagnosis not present

## 2018-05-30 DIAGNOSIS — L218 Other seborrheic dermatitis: Secondary | ICD-10-CM | POA: Diagnosis not present

## 2018-05-30 DIAGNOSIS — L821 Other seborrheic keratosis: Secondary | ICD-10-CM | POA: Diagnosis not present

## 2018-06-05 DIAGNOSIS — L400 Psoriasis vulgaris: Secondary | ICD-10-CM | POA: Diagnosis not present

## 2018-06-17 DIAGNOSIS — L400 Psoriasis vulgaris: Secondary | ICD-10-CM | POA: Diagnosis not present

## 2018-06-20 DIAGNOSIS — H6123 Impacted cerumen, bilateral: Secondary | ICD-10-CM | POA: Diagnosis not present

## 2018-06-26 DIAGNOSIS — L400 Psoriasis vulgaris: Secondary | ICD-10-CM | POA: Diagnosis not present

## 2018-07-03 DIAGNOSIS — R635 Abnormal weight gain: Secondary | ICD-10-CM | POA: Diagnosis not present

## 2018-07-03 DIAGNOSIS — M797 Fibromyalgia: Secondary | ICD-10-CM | POA: Diagnosis not present

## 2018-07-03 DIAGNOSIS — E559 Vitamin D deficiency, unspecified: Secondary | ICD-10-CM | POA: Diagnosis not present

## 2018-07-03 DIAGNOSIS — M810 Age-related osteoporosis without current pathological fracture: Secondary | ICD-10-CM | POA: Diagnosis not present

## 2018-07-03 DIAGNOSIS — G4700 Insomnia, unspecified: Secondary | ICD-10-CM | POA: Diagnosis not present

## 2018-07-04 DIAGNOSIS — L400 Psoriasis vulgaris: Secondary | ICD-10-CM | POA: Diagnosis not present

## 2018-07-05 ENCOUNTER — Other Ambulatory Visit: Payer: Self-pay

## 2018-07-05 ENCOUNTER — Encounter: Payer: Self-pay | Admitting: Obstetrics & Gynecology

## 2018-07-05 ENCOUNTER — Encounter

## 2018-07-05 ENCOUNTER — Other Ambulatory Visit (HOSPITAL_COMMUNITY)
Admission: RE | Admit: 2018-07-05 | Discharge: 2018-07-05 | Disposition: A | Discharge status: Home / Self Care | Payer: Medicare Other | Source: Ambulatory Visit | Attending: Obstetrics & Gynecology | Admitting: Obstetrics & Gynecology

## 2018-07-05 ENCOUNTER — Other Ambulatory Visit: Payer: Self-pay | Admitting: Obstetrics & Gynecology

## 2018-07-05 ENCOUNTER — Ambulatory Visit (INDEPENDENT_AMBULATORY_CARE_PROVIDER_SITE_OTHER): Payer: Medicare Other | Admitting: Obstetrics & Gynecology

## 2018-07-05 VITALS — BP 136/70 | HR 84 | Resp 16 | Ht 63.5 in | Wt 127.6 lb

## 2018-07-05 DIAGNOSIS — Z124 Encounter for screening for malignant neoplasm of cervix: Secondary | ICD-10-CM | POA: Insufficient documentation

## 2018-07-05 DIAGNOSIS — Z01419 Encounter for gynecological examination (general) (routine) without abnormal findings: Secondary | ICD-10-CM

## 2018-07-05 MED ORDER — ESTRADIOL 10 MCG VA TABS: 10.0000 ug | ORAL_TABLET | VAGINAL | 13 refills | Status: DC

## 2018-07-05 NOTE — Progress Notes (Signed)
67 y.o. G1P1 MarriedCaucasianF here for annual exam.  Doing well.  Brought recent lab work with her.  BMD showed mild improvement.  Continues to desire conservative management.    Stopped using vagifem this year due to cost.  Was >$200 per prescription.  Alternatives discussed.  She will consider.   Has been seen by ENT several times due to ear wax.  Nothing worrisome.     PCP:  Dr. Jackelyn Poling.   Patient's last menstrual period was 08/25/2004.          Sexually active: No.  The current method of family planning is post menopausal status.    Exercising: Yes.    walking Smoker:  no  Health Maintenance: Pap:  04/10/16 Neg. HR HPV:neg   04/01/14 Neg  History of abnormal Pap:  no MMG:  12/05/17 BIRADS2:Benign. F/u 1 year  Colonoscopy:  05/03/18.  Follow up five years.  BMD:   03/18/18 Osteopenia  TDaP:  unsure Pneumonia vaccine(s):  No Shingrix:   No Hep C testing: 04/10/16 Neg  Screening Labs: PCP   reports that she has quit smoking. She has never used smokeless tobacco. She reports that she does not drink alcohol or use drugs.  Past Medical History:  Diagnosis Date  . Atrophic vaginitis 03/20/2013  . ELEVATED BP READING WITHOUT DX HYPERTENSION 10/04/2010  . FIBROMYALGIA 10/04/2010  . IBS 10/26/2010  . Osteopenia   . Osteoporosis    in the spine  . Other anxiety states 10/26/2010  . PALPITATIONS, RECURRENT 10/26/2010  . RHINITIS 11/23/2010  . SINUSITIS - ACUTE-NOS 11/28/2010  . SLEEP DISORDER/DISTURBANCE 10/04/2010  . THYROID NODULE 10/04/2010    Past Surgical History:  Procedure Laterality Date  . BREAST SURGERY  12/25/1990 and 12/25/1992   2 Done on Rt breast  . CESAREAN SECTION  12/26/1987  . DILATION AND CURETTAGE OF UTERUS  2004  . TEMPOROMANDIBULAR JOINT SURGERY  12/25/1993    Current Outpatient Medications  Medication Sig Dispense Refill  . cholecalciferol (VITAMIN D) 1000 units tablet Take 1 tablet by mouth daily. With Vit K    . diazepam (VALIUM) 2 MG tablet Take 1  tablet by mouth daily as needed.  0  . Folate-B12-Intrinsic Factor (INTRINSI B12-FOLATE) [id number] MCG-MCG-MG TABS Place under the tongue daily.    . Lactobacillus Rhamnosus, GG, (CULTURELLE) CAPS Take 1 capsule by mouth daily.    Marland Kitchen MAGNESIUM PO Take 1 tablet by mouth daily.    . mupirocin ointment (BACTROBAN) 2 % daily as needed.  2  . propranolol (INDERAL) 10 MG tablet Take 10 mg by mouth daily.      No current facility-administered medications for this visit.     Family History  Problem Relation Age of Onset  . Heart disease Father   . Stroke Father   . Hypertension Mother        Mild  . Hypothyroidism Mother   . Migraines Sister   . Hypertension Sister   . Hypothyroidism Brother   . Cancer Brother        Cancer of neck  . Hypothyroidism Sister   . Hypertension Brother   . Diabetes Brother   . Heart disease Brother        Open Heart Disease  . Hyperlipidemia Brother   . Hypertension Brother   . Cancer Maternal Grandfather        pancreatic  . Osteoporosis Sister        hypercalciuria    Review of Systems  Constitutional:  Weight gain   Genitourinary:       Loss of sexual interest  Night urination   Endo/Heme/Allergies:       Craving sweet   All other systems reviewed and are negative.   Exam:   BP 136/70 (BP Location: Right Arm, Patient Position: Sitting, Cuff Size: Normal)   Pulse 84   Resp 16   Ht 5' 3.5" (1.613 m)   Wt 127 lb 9.6 oz (57.9 kg)   LMP 08/25/2004   BMI 22.25 kg/m     Height: 5' 3.5" (161.3 cm)  Ht Readings from Last 3 Encounters:  07/05/18 5' 3.5" (1.613 m)  04/30/17 5' 3.5" (1.613 m)  04/10/16 5' 3.5" (1.613 m)   General appearance: alert, cooperative and appears stated age Head: Normocephalic, without obvious abnormality, atraumatic Neck: no adenopathy, supple, symmetrical, trachea midline and thyroid normal to inspection and palpation Lungs: clear to auscultation bilaterally Breasts: normal appearance, no masses or  tenderness Heart: regular rate and rhythm Abdomen: soft, non-tender; bowel sounds normal; no masses,  no organomegaly Extremities: extremities normal, atraumatic, no cyanosis or edema Skin: Skin color, texture, turgor normal. No rashes or lesions Lymph nodes: Cervical, supraclavicular, and axillary nodes normal. No abnormal inguinal nodes palpated Neurologic: Grossly normal   Pelvic: External genitalia:  no lesions              Urethra:  normal appearing urethra with no masses, tenderness or lesions              Bartholins and Skenes: normal                 Vagina: normal appearing vagina with normal color and discharge, no lesions              Cervix: no lesions              Pap taken: Yes.   Bimanual Exam:  Uterus:  normal size, contour, position, consistency, mobility, non-tender              Adnexa: normal adnexa and no mass, fullness, tenderness               Rectovaginal: Confirms               Anus:  normal sphincter tone, no lesions  Chaperone was present for exam.  A:  Well Woman with normal exam PMP, no HRT Osteoporosis H/O fibromyalgia H/O thyroid nodule, did see Dr. Elyse Hsu for follow-up  P:   Mammogram guidelines reviewed.   pap smear obtained today Vagifem 13meq pv twice weekly.  #8/13RF Lab work done with PCP Vaccinations discussed.  Pt aware she should pneumonia vaccination this year.  She will check about last tetanus.  Shignrix vaccination discussed as well. return annually or prn

## 2018-07-05 NOTE — Telephone Encounter (Signed)
Pharmacy requesting alternative. Not covered by insurance   Please advise.

## 2018-07-09 LAB — CYTOLOGY - PAP: Diagnosis: NEGATIVE

## 2018-07-09 NOTE — Telephone Encounter (Signed)
Incoming fax states insurance prefers Estrace Vag cream 0.01%  Please advise.

## 2018-07-10 DIAGNOSIS — Z780 Asymptomatic menopausal state: Secondary | ICD-10-CM | POA: Diagnosis not present

## 2018-07-10 DIAGNOSIS — E349 Endocrine disorder, unspecified: Secondary | ICD-10-CM | POA: Diagnosis not present

## 2018-07-10 DIAGNOSIS — R891 Abnormal level of hormones in specimens from other organs, systems and tissues: Secondary | ICD-10-CM | POA: Diagnosis not present

## 2018-07-10 DIAGNOSIS — G4700 Insomnia, unspecified: Secondary | ICD-10-CM | POA: Diagnosis not present

## 2018-07-10 DIAGNOSIS — L400 Psoriasis vulgaris: Secondary | ICD-10-CM | POA: Diagnosis not present

## 2018-07-10 NOTE — Telephone Encounter (Signed)
Could you call pt and confirm that she does indeed want the vaginal estrogen cream?  She's been using coconut oil and may not want this rx.  Thanks.

## 2018-07-11 NOTE — Telephone Encounter (Signed)
Patient states she will think about options/cost and will call back if she wants to use the cream instead of the tablet.   Dr. Lestine Box

## 2018-07-12 DIAGNOSIS — M797 Fibromyalgia: Secondary | ICD-10-CM | POA: Diagnosis not present

## 2018-07-17 DIAGNOSIS — L400 Psoriasis vulgaris: Secondary | ICD-10-CM | POA: Diagnosis not present

## 2018-07-24 DIAGNOSIS — L400 Psoriasis vulgaris: Secondary | ICD-10-CM | POA: Diagnosis not present

## 2018-07-25 DIAGNOSIS — R141 Gas pain: Secondary | ICD-10-CM | POA: Diagnosis not present

## 2018-07-25 DIAGNOSIS — K5900 Constipation, unspecified: Secondary | ICD-10-CM | POA: Diagnosis not present

## 2018-07-30 DIAGNOSIS — L400 Psoriasis vulgaris: Secondary | ICD-10-CM | POA: Diagnosis not present

## 2018-08-05 DIAGNOSIS — L400 Psoriasis vulgaris: Secondary | ICD-10-CM | POA: Diagnosis not present

## 2018-08-14 DIAGNOSIS — L400 Psoriasis vulgaris: Secondary | ICD-10-CM | POA: Diagnosis not present

## 2018-08-15 DIAGNOSIS — B259 Cytomegaloviral disease, unspecified: Secondary | ICD-10-CM | POA: Diagnosis not present

## 2018-08-15 DIAGNOSIS — M810 Age-related osteoporosis without current pathological fracture: Secondary | ICD-10-CM | POA: Diagnosis not present

## 2018-08-15 DIAGNOSIS — G4700 Insomnia, unspecified: Secondary | ICD-10-CM | POA: Diagnosis not present

## 2018-08-15 DIAGNOSIS — B2700 Gammaherpesviral mononucleosis without complication: Secondary | ICD-10-CM | POA: Diagnosis not present

## 2018-08-15 DIAGNOSIS — R635 Abnormal weight gain: Secondary | ICD-10-CM | POA: Diagnosis not present

## 2018-08-15 DIAGNOSIS — E279 Disorder of adrenal gland, unspecified: Secondary | ICD-10-CM | POA: Diagnosis not present

## 2018-08-15 DIAGNOSIS — E349 Endocrine disorder, unspecified: Secondary | ICD-10-CM | POA: Diagnosis not present

## 2018-08-15 DIAGNOSIS — M797 Fibromyalgia: Secondary | ICD-10-CM | POA: Diagnosis not present

## 2018-08-15 DIAGNOSIS — E559 Vitamin D deficiency, unspecified: Secondary | ICD-10-CM | POA: Diagnosis not present

## 2018-08-20 DIAGNOSIS — H40013 Open angle with borderline findings, low risk, bilateral: Secondary | ICD-10-CM | POA: Diagnosis not present

## 2018-08-20 DIAGNOSIS — H40053 Ocular hypertension, bilateral: Secondary | ICD-10-CM | POA: Diagnosis not present

## 2018-08-21 DIAGNOSIS — L400 Psoriasis vulgaris: Secondary | ICD-10-CM | POA: Diagnosis not present

## 2018-08-22 DIAGNOSIS — E279 Disorder of adrenal gland, unspecified: Secondary | ICD-10-CM | POA: Diagnosis not present

## 2018-08-28 DIAGNOSIS — L400 Psoriasis vulgaris: Secondary | ICD-10-CM | POA: Diagnosis not present

## 2018-09-04 DIAGNOSIS — L400 Psoriasis vulgaris: Secondary | ICD-10-CM | POA: Diagnosis not present

## 2018-09-09 DIAGNOSIS — E559 Vitamin D deficiency, unspecified: Secondary | ICD-10-CM | POA: Diagnosis not present

## 2018-09-09 DIAGNOSIS — R635 Abnormal weight gain: Secondary | ICD-10-CM | POA: Diagnosis not present

## 2018-09-09 DIAGNOSIS — G4700 Insomnia, unspecified: Secondary | ICD-10-CM | POA: Diagnosis not present

## 2018-09-09 DIAGNOSIS — E279 Disorder of adrenal gland, unspecified: Secondary | ICD-10-CM | POA: Diagnosis not present

## 2018-09-09 DIAGNOSIS — M810 Age-related osteoporosis without current pathological fracture: Secondary | ICD-10-CM | POA: Diagnosis not present

## 2018-09-09 DIAGNOSIS — B259 Cytomegaloviral disease, unspecified: Secondary | ICD-10-CM | POA: Diagnosis not present

## 2018-09-09 DIAGNOSIS — M797 Fibromyalgia: Secondary | ICD-10-CM | POA: Diagnosis not present

## 2018-09-09 DIAGNOSIS — B2700 Gammaherpesviral mononucleosis without complication: Secondary | ICD-10-CM | POA: Diagnosis not present

## 2018-09-09 DIAGNOSIS — E349 Endocrine disorder, unspecified: Secondary | ICD-10-CM | POA: Diagnosis not present

## 2018-09-10 DIAGNOSIS — L400 Psoriasis vulgaris: Secondary | ICD-10-CM | POA: Diagnosis not present

## 2018-09-18 DIAGNOSIS — L400 Psoriasis vulgaris: Secondary | ICD-10-CM | POA: Diagnosis not present

## 2018-09-23 DIAGNOSIS — H6123 Impacted cerumen, bilateral: Secondary | ICD-10-CM | POA: Diagnosis not present

## 2018-09-24 DIAGNOSIS — Z23 Encounter for immunization: Secondary | ICD-10-CM | POA: Diagnosis not present

## 2018-09-25 DIAGNOSIS — L400 Psoriasis vulgaris: Secondary | ICD-10-CM | POA: Diagnosis not present

## 2018-09-27 DIAGNOSIS — I10 Essential (primary) hypertension: Secondary | ICD-10-CM | POA: Diagnosis not present

## 2018-09-27 DIAGNOSIS — Z6821 Body mass index (BMI) 21.0-21.9, adult: Secondary | ICD-10-CM | POA: Diagnosis not present

## 2018-10-02 DIAGNOSIS — L400 Psoriasis vulgaris: Secondary | ICD-10-CM | POA: Diagnosis not present

## 2018-10-09 DIAGNOSIS — L400 Psoriasis vulgaris: Secondary | ICD-10-CM | POA: Diagnosis not present

## 2018-10-10 DIAGNOSIS — L821 Other seborrheic keratosis: Secondary | ICD-10-CM | POA: Diagnosis not present

## 2018-10-10 DIAGNOSIS — D2261 Melanocytic nevi of right upper limb, including shoulder: Secondary | ICD-10-CM | POA: Diagnosis not present

## 2018-10-10 DIAGNOSIS — B070 Plantar wart: Secondary | ICD-10-CM | POA: Diagnosis not present

## 2018-10-10 DIAGNOSIS — L84 Corns and callosities: Secondary | ICD-10-CM | POA: Diagnosis not present

## 2018-10-10 DIAGNOSIS — L400 Psoriasis vulgaris: Secondary | ICD-10-CM | POA: Diagnosis not present

## 2018-10-10 DIAGNOSIS — L245 Irritant contact dermatitis due to other chemical products: Secondary | ICD-10-CM | POA: Diagnosis not present

## 2018-10-10 DIAGNOSIS — D2262 Melanocytic nevi of left upper limb, including shoulder: Secondary | ICD-10-CM | POA: Diagnosis not present

## 2018-10-10 DIAGNOSIS — D2371 Other benign neoplasm of skin of right lower limb, including hip: Secondary | ICD-10-CM | POA: Diagnosis not present

## 2018-10-16 DIAGNOSIS — L400 Psoriasis vulgaris: Secondary | ICD-10-CM | POA: Diagnosis not present

## 2018-10-22 DIAGNOSIS — L400 Psoriasis vulgaris: Secondary | ICD-10-CM | POA: Diagnosis not present

## 2018-10-23 DIAGNOSIS — I10 Essential (primary) hypertension: Secondary | ICD-10-CM | POA: Diagnosis not present

## 2018-10-23 DIAGNOSIS — L853 Xerosis cutis: Secondary | ICD-10-CM | POA: Diagnosis not present

## 2018-10-23 DIAGNOSIS — E279 Disorder of adrenal gland, unspecified: Secondary | ICD-10-CM | POA: Diagnosis not present

## 2018-10-23 DIAGNOSIS — R635 Abnormal weight gain: Secondary | ICD-10-CM | POA: Diagnosis not present

## 2018-10-30 DIAGNOSIS — L400 Psoriasis vulgaris: Secondary | ICD-10-CM | POA: Diagnosis not present

## 2018-11-05 DIAGNOSIS — L400 Psoriasis vulgaris: Secondary | ICD-10-CM | POA: Diagnosis not present

## 2018-11-13 DIAGNOSIS — L400 Psoriasis vulgaris: Secondary | ICD-10-CM | POA: Diagnosis not present

## 2018-11-14 ENCOUNTER — Ambulatory Visit (INDEPENDENT_AMBULATORY_CARE_PROVIDER_SITE_OTHER): Payer: Medicare Other | Admitting: Podiatry

## 2018-11-14 ENCOUNTER — Encounter: Payer: Self-pay | Admitting: Podiatry

## 2018-11-14 DIAGNOSIS — Q828 Other specified congenital malformations of skin: Secondary | ICD-10-CM

## 2018-11-14 NOTE — Progress Notes (Signed)
She presents today chief complaint of pain beneath the first and fifth metatarsal phalangeal joints bilaterally.  She states the skin is contracting cracking and peeling.  She tries Vaseline.  Objective: Vital signs are stable she alert and oriented x3 there is no erythema edema saline strange odor pulses are palpable neurologic sensorium is intact reactive hyperkeratotic lesion with peeling.  No open lesions or wounds.  Assessment: Skin fissure.  Plan: Provided her with information regarding emollient care of the foot.  Follow-up with me as needed.

## 2018-11-20 DIAGNOSIS — L400 Psoriasis vulgaris: Secondary | ICD-10-CM | POA: Diagnosis not present

## 2018-11-27 DIAGNOSIS — L400 Psoriasis vulgaris: Secondary | ICD-10-CM | POA: Diagnosis not present

## 2018-12-03 DIAGNOSIS — L400 Psoriasis vulgaris: Secondary | ICD-10-CM | POA: Diagnosis not present

## 2018-12-04 DIAGNOSIS — M797 Fibromyalgia: Secondary | ICD-10-CM | POA: Diagnosis not present

## 2018-12-04 DIAGNOSIS — D838 Other common variable immunodeficiencies: Secondary | ICD-10-CM | POA: Diagnosis not present

## 2018-12-04 DIAGNOSIS — D8989 Other specified disorders involving the immune mechanism, not elsewhere classified: Secondary | ICD-10-CM | POA: Diagnosis not present

## 2018-12-04 DIAGNOSIS — E349 Endocrine disorder, unspecified: Secondary | ICD-10-CM | POA: Diagnosis not present

## 2018-12-04 DIAGNOSIS — I10 Essential (primary) hypertension: Secondary | ICD-10-CM | POA: Diagnosis not present

## 2018-12-04 DIAGNOSIS — E279 Disorder of adrenal gland, unspecified: Secondary | ICD-10-CM | POA: Diagnosis not present

## 2018-12-04 DIAGNOSIS — D831 Common variable immunodeficiency with predominant immunoregulatory T-cell disorders: Secondary | ICD-10-CM | POA: Diagnosis not present

## 2018-12-10 DIAGNOSIS — Z1231 Encounter for screening mammogram for malignant neoplasm of breast: Secondary | ICD-10-CM | POA: Diagnosis not present

## 2018-12-11 DIAGNOSIS — L400 Psoriasis vulgaris: Secondary | ICD-10-CM | POA: Diagnosis not present

## 2018-12-16 DIAGNOSIS — L400 Psoriasis vulgaris: Secondary | ICD-10-CM | POA: Diagnosis not present

## 2018-12-26 DIAGNOSIS — L400 Psoriasis vulgaris: Secondary | ICD-10-CM | POA: Diagnosis not present

## 2018-12-31 DIAGNOSIS — H6123 Impacted cerumen, bilateral: Secondary | ICD-10-CM | POA: Diagnosis not present

## 2019-01-02 DIAGNOSIS — R002 Palpitations: Secondary | ICD-10-CM | POA: Diagnosis not present

## 2019-01-02 DIAGNOSIS — I10 Essential (primary) hypertension: Secondary | ICD-10-CM | POA: Diagnosis not present

## 2019-01-02 DIAGNOSIS — Z6821 Body mass index (BMI) 21.0-21.9, adult: Secondary | ICD-10-CM | POA: Diagnosis not present

## 2019-01-02 DIAGNOSIS — L400 Psoriasis vulgaris: Secondary | ICD-10-CM | POA: Diagnosis not present

## 2019-01-07 ENCOUNTER — Encounter: Payer: Self-pay | Admitting: Obstetrics & Gynecology

## 2019-01-09 DIAGNOSIS — L400 Psoriasis vulgaris: Secondary | ICD-10-CM | POA: Diagnosis not present

## 2019-01-13 ENCOUNTER — Encounter: Payer: Self-pay | Admitting: Podiatry

## 2019-01-15 DIAGNOSIS — L400 Psoriasis vulgaris: Secondary | ICD-10-CM | POA: Diagnosis not present

## 2019-01-21 DIAGNOSIS — L400 Psoriasis vulgaris: Secondary | ICD-10-CM | POA: Diagnosis not present

## 2019-01-21 DIAGNOSIS — B070 Plantar wart: Secondary | ICD-10-CM | POA: Diagnosis not present

## 2019-01-22 DIAGNOSIS — L400 Psoriasis vulgaris: Secondary | ICD-10-CM | POA: Diagnosis not present

## 2019-01-28 DIAGNOSIS — L400 Psoriasis vulgaris: Secondary | ICD-10-CM | POA: Diagnosis not present

## 2019-01-31 DIAGNOSIS — L400 Psoriasis vulgaris: Secondary | ICD-10-CM | POA: Diagnosis not present

## 2019-02-04 DIAGNOSIS — L400 Psoriasis vulgaris: Secondary | ICD-10-CM | POA: Diagnosis not present

## 2019-02-05 DIAGNOSIS — M797 Fibromyalgia: Secondary | ICD-10-CM | POA: Diagnosis not present

## 2019-02-05 DIAGNOSIS — M810 Age-related osteoporosis without current pathological fracture: Secondary | ICD-10-CM | POA: Diagnosis not present

## 2019-02-07 DIAGNOSIS — L400 Psoriasis vulgaris: Secondary | ICD-10-CM | POA: Diagnosis not present

## 2019-02-10 DIAGNOSIS — L853 Xerosis cutis: Secondary | ICD-10-CM | POA: Diagnosis not present

## 2019-02-10 DIAGNOSIS — R195 Other fecal abnormalities: Secondary | ICD-10-CM | POA: Diagnosis not present

## 2019-02-10 DIAGNOSIS — M797 Fibromyalgia: Secondary | ICD-10-CM | POA: Diagnosis not present

## 2019-02-11 DIAGNOSIS — L400 Psoriasis vulgaris: Secondary | ICD-10-CM | POA: Diagnosis not present

## 2019-02-18 DIAGNOSIS — H52223 Regular astigmatism, bilateral: Secondary | ICD-10-CM | POA: Diagnosis not present

## 2019-02-18 DIAGNOSIS — H43811 Vitreous degeneration, right eye: Secondary | ICD-10-CM | POA: Diagnosis not present

## 2019-02-18 DIAGNOSIS — H524 Presbyopia: Secondary | ICD-10-CM | POA: Diagnosis not present

## 2019-02-18 DIAGNOSIS — H40053 Ocular hypertension, bilateral: Secondary | ICD-10-CM | POA: Diagnosis not present

## 2019-02-18 DIAGNOSIS — Z83511 Family history of glaucoma: Secondary | ICD-10-CM | POA: Diagnosis not present

## 2019-02-19 DIAGNOSIS — L400 Psoriasis vulgaris: Secondary | ICD-10-CM | POA: Diagnosis not present

## 2019-02-24 DIAGNOSIS — L400 Psoriasis vulgaris: Secondary | ICD-10-CM | POA: Diagnosis not present

## 2019-03-04 DIAGNOSIS — L400 Psoriasis vulgaris: Secondary | ICD-10-CM | POA: Diagnosis not present

## 2019-03-11 DIAGNOSIS — L400 Psoriasis vulgaris: Secondary | ICD-10-CM | POA: Diagnosis not present

## 2019-03-18 DIAGNOSIS — L400 Psoriasis vulgaris: Secondary | ICD-10-CM | POA: Diagnosis not present

## 2019-03-21 DIAGNOSIS — E041 Nontoxic single thyroid nodule: Secondary | ICD-10-CM | POA: Diagnosis not present

## 2019-03-21 DIAGNOSIS — I10 Essential (primary) hypertension: Secondary | ICD-10-CM | POA: Diagnosis not present

## 2019-03-21 DIAGNOSIS — E559 Vitamin D deficiency, unspecified: Secondary | ICD-10-CM | POA: Diagnosis not present

## 2019-03-21 DIAGNOSIS — E7849 Other hyperlipidemia: Secondary | ICD-10-CM | POA: Diagnosis not present

## 2019-03-24 DIAGNOSIS — L400 Psoriasis vulgaris: Secondary | ICD-10-CM | POA: Diagnosis not present

## 2019-03-26 DIAGNOSIS — M797 Fibromyalgia: Secondary | ICD-10-CM | POA: Diagnosis not present

## 2019-03-26 DIAGNOSIS — Z8249 Family history of ischemic heart disease and other diseases of the circulatory system: Secondary | ICD-10-CM | POA: Diagnosis not present

## 2019-03-26 DIAGNOSIS — Z0000 Encounter for general adult medical examination without abnormal findings: Secondary | ICD-10-CM | POA: Diagnosis not present

## 2019-03-26 DIAGNOSIS — F439 Reaction to severe stress, unspecified: Secondary | ICD-10-CM | POA: Diagnosis not present

## 2019-03-26 DIAGNOSIS — I10 Essential (primary) hypertension: Secondary | ICD-10-CM | POA: Diagnosis not present

## 2019-03-26 DIAGNOSIS — M810 Age-related osteoporosis without current pathological fracture: Secondary | ICD-10-CM | POA: Diagnosis not present

## 2019-03-26 DIAGNOSIS — K219 Gastro-esophageal reflux disease without esophagitis: Secondary | ICD-10-CM | POA: Diagnosis not present

## 2019-03-26 DIAGNOSIS — E785 Hyperlipidemia, unspecified: Secondary | ICD-10-CM | POA: Diagnosis not present

## 2019-03-26 DIAGNOSIS — R82998 Other abnormal findings in urine: Secondary | ICD-10-CM | POA: Diagnosis not present

## 2019-03-26 DIAGNOSIS — Z1331 Encounter for screening for depression: Secondary | ICD-10-CM | POA: Diagnosis not present

## 2019-03-26 DIAGNOSIS — E559 Vitamin D deficiency, unspecified: Secondary | ICD-10-CM | POA: Diagnosis not present

## 2019-03-28 DIAGNOSIS — Z23 Encounter for immunization: Secondary | ICD-10-CM | POA: Diagnosis not present

## 2019-04-01 DIAGNOSIS — L400 Psoriasis vulgaris: Secondary | ICD-10-CM | POA: Diagnosis not present

## 2019-04-02 DIAGNOSIS — M797 Fibromyalgia: Secondary | ICD-10-CM | POA: Diagnosis not present

## 2019-04-02 DIAGNOSIS — I10 Essential (primary) hypertension: Secondary | ICD-10-CM | POA: Diagnosis not present

## 2019-04-02 DIAGNOSIS — T50A95A Adverse effect of other bacterial vaccines, initial encounter: Secondary | ICD-10-CM | POA: Diagnosis not present

## 2019-04-10 DIAGNOSIS — L400 Psoriasis vulgaris: Secondary | ICD-10-CM | POA: Diagnosis not present

## 2019-04-15 DIAGNOSIS — H6123 Impacted cerumen, bilateral: Secondary | ICD-10-CM | POA: Diagnosis not present

## 2019-04-17 DIAGNOSIS — L400 Psoriasis vulgaris: Secondary | ICD-10-CM | POA: Diagnosis not present

## 2019-04-24 DIAGNOSIS — L400 Psoriasis vulgaris: Secondary | ICD-10-CM | POA: Diagnosis not present

## 2019-05-01 DIAGNOSIS — L400 Psoriasis vulgaris: Secondary | ICD-10-CM | POA: Diagnosis not present

## 2019-05-08 DIAGNOSIS — L870 Keratosis follicularis et parafollicularis in cutem penetrans: Secondary | ICD-10-CM | POA: Diagnosis not present

## 2019-05-12 ENCOUNTER — Other Ambulatory Visit: Payer: Self-pay | Admitting: Internal Medicine

## 2019-05-12 DIAGNOSIS — Z0000 Encounter for general adult medical examination without abnormal findings: Secondary | ICD-10-CM

## 2019-05-12 DIAGNOSIS — H43811 Vitreous degeneration, right eye: Secondary | ICD-10-CM | POA: Diagnosis not present

## 2019-05-15 DIAGNOSIS — L400 Psoriasis vulgaris: Secondary | ICD-10-CM | POA: Diagnosis not present

## 2019-05-20 ENCOUNTER — Ambulatory Visit
Admission: RE | Admit: 2019-05-20 | Discharge: 2019-05-20 | Disposition: A | Discharge status: Home / Self Care | Payer: Medicare Other | Source: Ambulatory Visit | Attending: Internal Medicine | Admitting: Internal Medicine

## 2019-05-20 ENCOUNTER — Other Ambulatory Visit: Payer: Self-pay | Admitting: Internal Medicine

## 2019-05-20 DIAGNOSIS — Z0000 Encounter for general adult medical examination without abnormal findings: Secondary | ICD-10-CM

## 2019-05-22 ENCOUNTER — Ambulatory Visit
Admission: RE | Admit: 2019-05-22 | Discharge: 2019-05-22 | Disposition: A | Discharge status: Home / Self Care | Payer: No Typology Code available for payment source | Source: Ambulatory Visit | Attending: Internal Medicine | Admitting: Internal Medicine

## 2019-05-22 DIAGNOSIS — Z0000 Encounter for general adult medical examination without abnormal findings: Secondary | ICD-10-CM

## 2019-05-22 DIAGNOSIS — L400 Psoriasis vulgaris: Secondary | ICD-10-CM | POA: Diagnosis not present

## 2019-05-29 DIAGNOSIS — L400 Psoriasis vulgaris: Secondary | ICD-10-CM | POA: Diagnosis not present

## 2019-05-30 ENCOUNTER — Other Ambulatory Visit: Payer: Self-pay

## 2019-06-03 DIAGNOSIS — L870 Keratosis follicularis et parafollicularis in cutem penetrans: Secondary | ICD-10-CM | POA: Diagnosis not present

## 2019-06-11 DIAGNOSIS — L400 Psoriasis vulgaris: Secondary | ICD-10-CM | POA: Diagnosis not present

## 2019-06-19 DIAGNOSIS — L718 Other rosacea: Secondary | ICD-10-CM | POA: Diagnosis not present

## 2019-06-19 DIAGNOSIS — L400 Psoriasis vulgaris: Secondary | ICD-10-CM | POA: Diagnosis not present

## 2019-06-19 DIAGNOSIS — B070 Plantar wart: Secondary | ICD-10-CM | POA: Diagnosis not present

## 2019-06-19 DIAGNOSIS — H43812 Vitreous degeneration, left eye: Secondary | ICD-10-CM | POA: Diagnosis not present

## 2019-06-26 DIAGNOSIS — L400 Psoriasis vulgaris: Secondary | ICD-10-CM | POA: Diagnosis not present

## 2019-07-02 DIAGNOSIS — L400 Psoriasis vulgaris: Secondary | ICD-10-CM | POA: Diagnosis not present

## 2019-07-09 DIAGNOSIS — L400 Psoriasis vulgaris: Secondary | ICD-10-CM | POA: Diagnosis not present

## 2019-07-16 DIAGNOSIS — L400 Psoriasis vulgaris: Secondary | ICD-10-CM | POA: Diagnosis not present

## 2019-07-23 DIAGNOSIS — L400 Psoriasis vulgaris: Secondary | ICD-10-CM | POA: Diagnosis not present

## 2019-07-25 DIAGNOSIS — H6123 Impacted cerumen, bilateral: Secondary | ICD-10-CM | POA: Diagnosis not present

## 2019-07-26 DIAGNOSIS — K5780 Diverticulitis of intestine, part unspecified, with perforation and abscess without bleeding: Secondary | ICD-10-CM

## 2019-07-26 HISTORY — DX: Diverticulitis of intestine, part unspecified, with perforation and abscess without bleeding: K57.80

## 2019-08-14 DIAGNOSIS — H0014 Chalazion left upper eyelid: Secondary | ICD-10-CM | POA: Diagnosis not present

## 2019-08-17 ENCOUNTER — Emergency Department (HOSPITAL_COMMUNITY): Payer: Medicare Other

## 2019-08-17 ENCOUNTER — Other Ambulatory Visit: Payer: Self-pay

## 2019-08-17 ENCOUNTER — Inpatient Hospital Stay (HOSPITAL_COMMUNITY)
Admission: EM | Admit: 2019-08-17 | Discharge: 2019-08-23 | DRG: 392 | Disposition: A | Discharge status: Home / Self Care | Payer: Medicare Other | Attending: Surgery | Admitting: Surgery

## 2019-08-17 DIAGNOSIS — K5700 Diverticulitis of small intestine with perforation and abscess without bleeding: Principal | ICD-10-CM | POA: Diagnosis present

## 2019-08-17 DIAGNOSIS — Z20828 Contact with and (suspected) exposure to other viral communicable diseases: Secondary | ICD-10-CM | POA: Diagnosis present

## 2019-08-17 DIAGNOSIS — M810 Age-related osteoporosis without current pathological fracture: Secondary | ICD-10-CM | POA: Diagnosis present

## 2019-08-17 DIAGNOSIS — Z87891 Personal history of nicotine dependence: Secondary | ICD-10-CM

## 2019-08-17 DIAGNOSIS — E876 Hypokalemia: Secondary | ICD-10-CM | POA: Diagnosis present

## 2019-08-17 DIAGNOSIS — Z8249 Family history of ischemic heart disease and other diseases of the circulatory system: Secondary | ICD-10-CM

## 2019-08-17 DIAGNOSIS — Z833 Family history of diabetes mellitus: Secondary | ICD-10-CM | POA: Diagnosis not present

## 2019-08-17 DIAGNOSIS — K5710 Diverticulosis of small intestine without perforation or abscess without bleeding: Secondary | ICD-10-CM | POA: Diagnosis not present

## 2019-08-17 DIAGNOSIS — R1013 Epigastric pain: Secondary | ICD-10-CM | POA: Diagnosis not present

## 2019-08-17 DIAGNOSIS — Z79899 Other long term (current) drug therapy: Secondary | ICD-10-CM | POA: Diagnosis not present

## 2019-08-17 DIAGNOSIS — K859 Acute pancreatitis without necrosis or infection, unspecified: Secondary | ICD-10-CM | POA: Diagnosis not present

## 2019-08-17 DIAGNOSIS — I10 Essential (primary) hypertension: Secondary | ICD-10-CM | POA: Diagnosis not present

## 2019-08-17 DIAGNOSIS — R1110 Vomiting, unspecified: Secondary | ICD-10-CM | POA: Diagnosis not present

## 2019-08-17 DIAGNOSIS — R002 Palpitations: Secondary | ICD-10-CM | POA: Diagnosis not present

## 2019-08-17 DIAGNOSIS — R112 Nausea with vomiting, unspecified: Secondary | ICD-10-CM | POA: Diagnosis not present

## 2019-08-17 DIAGNOSIS — M797 Fibromyalgia: Secondary | ICD-10-CM | POA: Diagnosis present

## 2019-08-17 DIAGNOSIS — Z03818 Encounter for observation for suspected exposure to other biological agents ruled out: Secondary | ICD-10-CM | POA: Diagnosis not present

## 2019-08-17 LAB — COMPREHENSIVE METABOLIC PANEL
ALT: 22 U/L (ref 0–44)
AST: 33 U/L (ref 15–41)
Albumin: 3.9 g/dL (ref 3.5–5.0)
Alkaline Phosphatase: 74 U/L (ref 38–126)
Anion gap: 11 (ref 5–15)
BUN: 12 mg/dL (ref 8–23)
CO2: 27 mmol/L (ref 22–32)
Calcium: 9.3 mg/dL (ref 8.9–10.3)
Chloride: 97 mmol/L — ABNORMAL LOW (ref 98–111)
Creatinine, Ser: 0.81 mg/dL (ref 0.44–1.00)
GFR calc Af Amer: >60 mL/min (ref >60)
GFR calc non Af Amer: >60 mL/min (ref >60)
Glucose, Bld: 131 mg/dL — ABNORMAL HIGH (ref 70–99)
Potassium: 3.4 mmol/L — ABNORMAL LOW (ref 3.5–5.1)
Sodium: 135 mmol/L (ref 135–145)
Total Bilirubin: 1.7 mg/dL — ABNORMAL HIGH (ref 0.3–1.2)
Total Protein: 6.5 g/dL (ref 6.5–8.1)

## 2019-08-17 LAB — CBC WITH DIFFERENTIAL/PLATELET
Abs Immature Granulocytes: 0.08 10*3/uL — ABNORMAL HIGH (ref 0.00–0.07)
Basophils Absolute: 0 10*3/uL (ref 0.0–0.1)
Basophils Relative: 0 %
Eosinophils Absolute: 0 10*3/uL (ref 0.0–0.5)
Eosinophils Relative: 0 %
HCT: 44.7 % (ref 36.0–46.0)
Hemoglobin: 15 g/dL (ref 12.0–15.0)
Immature Granulocytes: 1 %
Lymphocytes Relative: 7 %
Lymphs Abs: 0.9 10*3/uL (ref 0.7–4.0)
MCH: 29 pg (ref 26.0–34.0)
MCHC: 33.6 g/dL (ref 30.0–36.0)
MCV: 86.5 fL (ref 80.0–100.0)
Monocytes Absolute: 0.6 10*3/uL (ref 0.1–1.0)
Monocytes Relative: 4 %
Neutro Abs: 11.9 10*3/uL — ABNORMAL HIGH (ref 1.7–7.7)
Neutrophils Relative %: 88 %
Platelets: 208 10*3/uL (ref 150–400)
RBC: 5.17 MIL/uL — ABNORMAL HIGH (ref 3.87–5.11)
RDW: 12.4 % (ref 11.5–15.5)
WBC: 13.5 10*3/uL — ABNORMAL HIGH (ref 4.0–10.5)
nRBC: 0 % (ref 0.0–0.2)

## 2019-08-17 LAB — URINALYSIS, ROUTINE W REFLEX MICROSCOPIC
Bacteria, UA: NONE SEEN
Bilirubin Urine: NEGATIVE
Glucose, UA: NEGATIVE mg/dL
Ketones, ur: 20 mg/dL — AB
Leukocytes,Ua: NEGATIVE
Nitrite: NEGATIVE
Protein, ur: 30 mg/dL — AB
Specific Gravity, Urine: >1.046 — ABNORMAL HIGH (ref 1.005–1.030)
pH: 7 (ref 5.0–8.0)

## 2019-08-17 LAB — CREATININE, SERUM
Creatinine, Ser: 0.77 mg/dL (ref 0.44–1.00)
GFR calc Af Amer: >60 mL/min (ref >60)
GFR calc non Af Amer: >60 mL/min (ref >60)

## 2019-08-17 LAB — TYPE AND SCREEN
ABO/RH(D): B POS
Antibody Screen: NEGATIVE

## 2019-08-17 LAB — ABO/RH: ABO/RH(D): B POS

## 2019-08-17 LAB — CBC
HCT: 43.7 % (ref 36.0–46.0)
Hemoglobin: 14.6 g/dL (ref 12.0–15.0)
MCH: 29.3 pg (ref 26.0–34.0)
MCHC: 33.4 g/dL (ref 30.0–36.0)
MCV: 87.8 fL (ref 80.0–100.0)
Platelets: 184 10*3/uL (ref 150–400)
RBC: 4.98 MIL/uL (ref 3.87–5.11)
RDW: 12.6 % (ref 11.5–15.5)
WBC: 11.8 10*3/uL — ABNORMAL HIGH (ref 4.0–10.5)
nRBC: 0 % (ref 0.0–0.2)

## 2019-08-17 LAB — SARS CORONAVIRUS 2 (TAT 6-24 HRS): SARS Coronavirus 2: NEGATIVE

## 2019-08-17 LAB — LIPASE, BLOOD: Lipase: 288 U/L — ABNORMAL HIGH (ref 11–51)

## 2019-08-17 MED ORDER — ONDANSETRON HCL 4 MG/2ML IJ SOLN
4.0000 mg | Freq: Once | INTRAMUSCULAR | Status: AC
  Administered 2019-08-17: 4 mg via INTRAVENOUS
  Filled 2019-08-17: qty 2

## 2019-08-17 MED ORDER — ONDANSETRON 4 MG PO TBDP: 4.0000 mg | ORAL_TABLET | Freq: Four times a day (QID) | ORAL | Status: DC | PRN

## 2019-08-17 MED ORDER — ACETAMINOPHEN 325 MG PO TABS
650.0000 mg | ORAL_TABLET | Freq: Four times a day (QID) | ORAL | Status: DC | PRN
  Filled 2019-08-17: qty 2

## 2019-08-17 MED ORDER — PIPERACILLIN-TAZOBACTAM 3.375 G IVPB
3.3750 g | Freq: Three times a day (TID) | INTRAVENOUS | Status: DC
  Administered 2019-08-17 – 2019-08-23 (×17): 3.375 g via INTRAVENOUS
  Filled 2019-08-17 (×15): qty 50

## 2019-08-17 MED ORDER — METOPROLOL TARTRATE 5 MG/5ML IV SOLN: 5.0000 mg | Freq: Four times a day (QID) | INTRAVENOUS | Status: DC | PRN

## 2019-08-17 MED ORDER — METRONIDAZOLE IN NACL 5-0.79 MG/ML-% IV SOLN: 500.0000 mg | Freq: Once | INTRAVENOUS | Status: DC

## 2019-08-17 MED ORDER — ONDANSETRON HCL 4 MG/2ML IJ SOLN: 4.0000 mg | Freq: Four times a day (QID) | INTRAMUSCULAR | Status: DC | PRN

## 2019-08-17 MED ORDER — ENOXAPARIN SODIUM 40 MG/0.4ML ~~LOC~~ SOLN
40.0000 mg | SUBCUTANEOUS | Status: DC
  Administered 2019-08-17 – 2019-08-22 (×6): 40 mg via SUBCUTANEOUS
  Filled 2019-08-17 (×6): qty 0.4

## 2019-08-17 MED ORDER — SODIUM CHLORIDE 0.9 % IV BOLUS
500.0000 mL | Freq: Once | INTRAVENOUS | Status: AC
  Administered 2019-08-17: 500 mL via INTRAVENOUS

## 2019-08-17 MED ORDER — PIPERACILLIN-TAZOBACTAM 3.375 G IVPB 30 MIN
3.3750 g | Freq: Once | INTRAVENOUS | Status: AC
  Administered 2019-08-17: 3.375 g via INTRAVENOUS
  Filled 2019-08-17: qty 50

## 2019-08-17 MED ORDER — MORPHINE SULFATE (PF) 2 MG/ML IV SOLN
2.0000 mg | Freq: Once | INTRAVENOUS | Status: AC
  Administered 2019-08-17: 2 mg via INTRAVENOUS
  Filled 2019-08-17: qty 1

## 2019-08-17 MED ORDER — ACETAMINOPHEN 650 MG RE SUPP
650.0000 mg | Freq: Four times a day (QID) | RECTAL | Status: DC | PRN
  Administered 2019-08-17 – 2019-08-19 (×6): 650 mg via RECTAL
  Filled 2019-08-17 (×6): qty 1

## 2019-08-17 MED ORDER — IOHEXOL 300 MG/ML  SOLN
100.0000 mL | Freq: Once | INTRAMUSCULAR | Status: AC | PRN
  Administered 2019-08-17: 100 mL via INTRAVENOUS

## 2019-08-17 MED ORDER — SODIUM CHLORIDE 0.9 % IV SOLN: 2.0000 g | Freq: Once | INTRAVENOUS | Status: DC

## 2019-08-17 MED ORDER — OXYCODONE HCL 5 MG PO TABS: 5.0000 mg | ORAL_TABLET | ORAL | Status: DC | PRN

## 2019-08-17 MED ORDER — MORPHINE SULFATE (PF) 2 MG/ML IV SOLN: 2.0000 mg | INTRAVENOUS | Status: DC | PRN

## 2019-08-17 MED ORDER — DEXTROSE-NACL 5-0.45 % IV SOLN
INTRAVENOUS | Status: DC
  Administered 2019-08-17 – 2019-08-18 (×2): via INTRAVENOUS

## 2019-08-17 NOTE — ED Provider Notes (Signed)
[facility] EMERGENCY DEPARTMENT Provider Note   CSN: [id number] Arrival date & time: 08/17/19  1013     History   Chief Complaint Chief Complaint  Patient presents with  . Abdominal Pain    HPI SAMAREE KINNER is a 68 y.o. female.     HPI  68 yo female complaining of diffuse abdominal pain that began yesterday.  Pain is sharp, 7/10.  Treated with tylenol last night without relief.  She has had associated nausea and vomiting of yesterday's lunch,now clear.  No diarrhea or blood in stool or emesis. No cough, dyspnea.   Past Medical History:  Diagnosis Date  . Atrophic vaginitis 03/20/2013  . ELEVATED BP READING WITHOUT DX HYPERTENSION 10/04/2010  . FIBROMYALGIA 10/04/2010  . IBS 10/26/2010  . Osteopenia   . Osteoporosis    in the spine  . Other anxiety states 10/26/2010  . PALPITATIONS, RECURRENT 10/26/2010  . RHINITIS 11/23/2010  . SINUSITIS - ACUTE-NOS 11/28/2010  . SLEEP DISORDER/DISTURBANCE 10/04/2010  . THYROID NODULE 10/04/2010    Patient Active Problem List   Diagnosis Date Noted  . Essential hypertension 11/28/2016  . Psoriasis 09/21/2016  . Osteoporosis 04/01/2014  . Atrophic vaginitis 03/20/2013  . IBS 10/26/2010  . PALPITATIONS, RECURRENT 10/26/2010  . Thyroid nodule 10/04/2010  . FIBROMYALGIA 10/04/2010  . Disturbance in sleep behavior 10/04/2010  . ELEVATED BP READING WITHOUT DX HYPERTENSION 10/04/2010    Past Surgical History:  Procedure Laterality Date  . BREAST SURGERY  12/25/1990 and 12/25/1992   2 Done on Rt breast  . CESAREAN SECTION  12/26/1987  . DILATION AND CURETTAGE OF UTERUS  2004  . TEMPOROMANDIBULAR JOINT SURGERY  12/25/1993     OB History    Gravida  1   Para  1   Term      Preterm      AB      Living  1     SAB      TAB      Ectopic      Multiple      Live Births               Home Medications    Prior to Admission medications   Medication Sig Start Date End Date Taking?  Authorizing Provider  cholecalciferol (VITAMIN D) 1000 units tablet Take 1 tablet by mouth daily. With Vit K 06/08/10   [provider]  diazepam (VALIUM) 2 MG tablet Take 1 tablet by mouth daily as needed. 05/28/18   [provider]  Folate-B12-Intrinsic Factor (INTRINSI B12-FOLATE) [id number] MCG-MCG-MG TABS Place under the tongue daily. 09/15/10   [provider]  Lactobacillus Rhamnosus, GG, (CULTURELLE) CAPS Take 1 capsule by mouth daily. 09/15/10   [provider]  MAGNESIUM PO Take 1 tablet by mouth daily. 06/08/10   [provider]  mupirocin ointment (BACTROBAN) 2 % daily as needed. 01/24/17   [provider]  propranolol (INDERAL) 10 MG tablet Take 10 mg by mouth daily.     [provider]    Family History Family History  Problem Relation Age of Onset  . Heart disease Father   . Stroke Father   . Hypertension Mother        Mild  . Hypothyroidism Mother   . Migraines Sister   . Hypertension Sister   . Hypothyroidism Brother   . Cancer Brother        Cancer of neck  . Hypothyroidism Sister   . Hypertension  Brother   . Diabetes Brother   . Heart disease Brother        Open Heart Disease  . Hyperlipidemia Brother   . Hypertension Brother   . Cancer Maternal Grandfather        pancreatic  . Osteoporosis Sister        hypercalciuria    Social History Social History   Tobacco Use  . Smoking status: Former Research scientist (life sciences)  . Smokeless tobacco: Never Used  . Tobacco comment: quit in 1982  Substance Use Topics  . Alcohol use: No  . Drug use: No     Allergies   Augmentin [amoxicillin-pot clavulanate], Nsaids, Adhesive [tape], Erythromycin, Latex, Levaquin [levofloxacin in d5w], Lisinopril, Neosporin [neomycin-bacitracin zn-polymyx], and Nickel   Review of Systems Review of Systems  All other systems reviewed and are negative.    Physical Exam Updated Vital Signs BP 131/61   Pulse 91   Temp 98 F (36.7 C)  (Oral)   Resp 18   LMP 08/25/2004   SpO2 98%   Physical Exam Vitals signs and nursing note reviewed.  Constitutional:      General: She is not in acute distress.    Appearance: She is well-developed. She is not ill-appearing.  HENT:     Head: Normocephalic and atraumatic.     Mouth/Throat:     Mouth: Mucous membranes are moist.  Eyes:     Extraocular Movements: Extraocular movements intact.  Cardiovascular:     Rate and Rhythm: Normal rate and regular rhythm.  Pulmonary:     Effort: Pulmonary effort is normal.     Breath sounds: Normal breath sounds.  Abdominal:     General: Abdomen is flat. Bowel sounds are decreased.     Tenderness: There is generalized abdominal tenderness.     Hernia: No hernia is present.     Comments: Abdomen firm  Skin:    General: Skin is warm and dry.     Capillary Refill: Capillary refill takes less than 2 seconds.  Neurological:     General: No focal deficit present.     Mental Status: She is alert.  Psychiatric:        Mood and Affect: Mood normal.      ED Treatments / Results  Labs (all labs ordered are listed, but only abnormal results are displayed) Labs Reviewed  CBC WITH DIFFERENTIAL/PLATELET  COMPREHENSIVE METABOLIC PANEL  URINALYSIS, ROUTINE W REFLEX MICROSCOPIC  LIPASE, BLOOD  TYPE AND SCREEN    EKG EKG Interpretation  Date/Time:  Sunday August 17 2019 10:50:38 EDT Ventricular Rate:  92 PR Interval:    QRS Duration: 93 QT Interval:  365 QTC Calculation: 452 R Axis:   52 Text Interpretation:  Sinus rhythm Low voltage, precordial leads Probable anteroseptal infarct, old Confirmed by Pattricia Boss [phone]) on 08/17/2019 1:52:24 PM   Radiology Ct Abdomen Pelvis W Contrast  Result Date: 08/17/2019 CLINICAL DATA:  Mid abdominal pain, nausea/vomiting EXAM: CT ABDOMEN AND PELVIS WITH CONTRAST TECHNIQUE: Multidetector CT imaging of the abdomen and pelvis was performed using the standard protocol following bolus administration  of intravenous contrast. CONTRAST:  128mL OMNIPAQUE IOHEXOL 300 MG/ML  SOLN COMPARISON:  None. FINDINGS: Lower chest: Lung bases are essentially clear. Hepatobiliary: Liver is within normal limits. Gallbladder is unremarkable. No intrahepatic or extrahepatic ductal dilatation. Pancreas: Mild fluid/stranding along the inferior aspect of the pancreatic head/uncinate process, favored to be secondary to duodenal inflammatory changes (described below). Concomitant pancreatitis is also possible. Spleen: Within normal limits. Adrenals/Urinary  Tract: Adrenals is within normal limits. Subcentimeter left upper pole renal cyst (series 3/image 26). Right kidney is within normal limits. No hydronephrosis. Bladder is within normal limits. Stomach/Bowel: Stomach is within normal limits. 2.7 cm extraluminal gas/debris filled structure adjacent to the 2nd portion of the duodenum (series 3/image 36) which favors a duodenal diverticulum given suspected communication (coronal image 29). Adjacent extraluminal gas suggest localized perforation/duodenal diverticulitis (coronal image 32), with surrounding inflammatory stranding, fluid, and mucosal edema (series 3/image 41). No evidence of bowel obstruction. Appendix is not discretely visualized. Sigmoid diverticulosis, without evidence of diverticulitis. Vascular/Lymphatic: No evidence of abdominal aortic aneurysm. Atherosclerotic calcifications of the abdominal aorta and branch vessels. No suspicious abdominopelvic lymphadenopathy. Reproductive: Uterus is within normal limits. Bilateral ovaries are within normal limits. Other: Localized fluid/gas, as described above. Otherwise, no abdominopelvic ascites. No drainable fluid. Musculoskeletal: Visualized osseous structures are within normal limits. IMPRESSION: Suspected duodenal diverticulitis with localized extraluminal gas and surrounding inflammatory stranding/fluid, as described above. Localized duodenal perforation is considered less  likely. Mild fluid/stranding along the posterior aspect of the pancreatic head/uncinate process, favored to be secondary to duodenal inflammatory changes, although concomitant pancreatitis is also possible. No evidence of bowel obstruction. These results were called by telephone at the time of interpretation on 08/17/2019 at 1:30 pm to Dr. Pattricia Boss , who verbally acknowledged these results. Electronically Signed   By: Julian Hy M.D.   On: 08/17/2019 13:34   Dg Chest Port 1 View  Result Date: 08/17/2019 CLINICAL DATA:  Abdominal pain, nausea and vomiting since yesterday. Some chills. No chest complains. States she has had a high heart rate. No hx of heart or lung problems. No previous surgeries. HTN. No diabetes. Nonsmoker. EXAM: PORTABLE CHEST 1 VIEW COMPARISON:  06/28/2011 FINDINGS: Heart size is normal. Lungs are clear. No pulmonary edema. No free intraperitoneal air beneath the diaphragm. IMPRESSION: No active disease. Electronically Signed   By: Nolon Nations M.D.   On: 08/17/2019 11:42    Procedures Procedures (including critical care time)  Medications Ordered in ED Medications  sodium chloride 0.9 % bolus 500 mL (has no administration in time range)  morphine 2 MG/ML injection 2 mg (has no administration in time range)     Initial Impression / Assessment and Plan / ED Course  I have reviewed the triage vital signs and the nursing notes.  Pertinent labs & imaging results that were available during my care of the patient were reviewed by me and considered in my medical decision making (see chart for details).    68 yo female presents co sudden onset of diffuse abdominal pain yesterday.  Here patient is hemodynamically stable.  CT reveals .extraluminal gas next to the second part of the duodenum.  Mild leukocytosis, elevated lipase.  CT results discussed with radiologist, Dr. [place] Pink.  Consult to Dr. Kieth Brightly- he will be in to see. Broad spectrum abx ordered Patient npo  Discussed results with patient and her husband.        Final Clinical Impressions(s) / ED Diagnoses   Final diagnoses:  Perforated diverticulum of duodenum  Acute pancreatitis, unspecified complication status, unspecified pancreatitis type    ED Discharge Orders    None       Pattricia Boss, MD 08/17/19 1358

## 2019-08-17 NOTE — Progress Notes (Signed)
Pharmacy Antibiotic Note  Denise Young is a 68 y.o. female admitted on 08/17/2019 with abdominal pain.  Pharmacy has been consulted for zosyn dosing. Pt is afebrile but WBC is elevated and SCr is WNL.   Plan: Zosyn 3.375gm IV Q8H (4 hr inf) F/u renal fxn, C&S, clinical status and LOT *Pharmacy will sign off as no dose adjustments are anticipated. Thank you for the consult!    Temp (24hrs), Avg:98 F (36.7 C), Min:98 F (36.7 C), Max:98 F (36.7 C)  Recent Labs  Lab 08/17/19 1056  WBC 13.5*  CREATININE 0.81    CrCl cannot be calculated (Unknown ideal weight.).    Thank you for allowing pharmacy to be a part of this patient's care.  Aiman Sonn, Rande Lawman 08/17/2019 2:44 PM

## 2019-08-17 NOTE — Progress Notes (Signed)
Patient arrived to 6N03, A&Ox4, LAC IV intact and saline locked, and self ambulated from stretcher to bed.  Patient oriented to room and equipment and visitation regulations discussed and patient verbalizes understanding.

## 2019-08-17 NOTE — ED Triage Notes (Addendum)
Pt c/o mid abd pain that began yesterday afternoon along with n/v ; pt denies any diarrhea or fevers ; pt also c/o lower back pain , denies any injury

## 2019-08-17 NOTE — ED Notes (Signed)
Radiology at bedside

## 2019-08-17 NOTE — H&P (Signed)
Reason for Consult: abdominal pain Referring Physician: [name] is an 68 y.o. female.  HPI: 68 yo female with 24 hours severe epigastric pain. She had been feeling under the weather the last week but this pain came on suddenly. She has been vomiting all morning. She has never been told she had an ulcer or diverticulum. She does not smoke. She does not take NSAIds.  Past Medical History:  Diagnosis Date  . Atrophic vaginitis 03/20/2013  . ELEVATED BP READING WITHOUT DX HYPERTENSION 10/04/2010  . FIBROMYALGIA 10/04/2010  . IBS 10/26/2010  . Osteopenia   . Osteoporosis    in the spine  . Other anxiety states 10/26/2010  . PALPITATIONS, RECURRENT 10/26/2010  . RHINITIS 11/23/2010  . SINUSITIS - ACUTE-NOS 11/28/2010  . SLEEP DISORDER/DISTURBANCE 10/04/2010  . THYROID NODULE 10/04/2010    Past Surgical History:  Procedure Laterality Date  . BREAST SURGERY  12/25/1990 and 12/25/1992   2 Done on Rt breast  . CESAREAN SECTION  12/26/1987  . DILATION AND CURETTAGE OF UTERUS  2004  . TEMPOROMANDIBULAR JOINT SURGERY  12/25/1993    Family History  Problem Relation Age of Onset  . Heart disease Father   . Stroke Father   . Hypertension Mother        Mild  . Hypothyroidism Mother   . Migraines Sister   . Hypertension Sister   . Hypothyroidism Brother   . Cancer Brother        Cancer of neck  . Hypothyroidism Sister   . Hypertension Brother   . Diabetes Brother   . Heart disease Brother        Open Heart Disease  . Hyperlipidemia Brother   . Hypertension Brother   . Cancer Maternal Grandfather        pancreatic  . Osteoporosis Sister        hypercalciuria    Social History:  reports that she has quit smoking. She has never used smokeless tobacco. She reports that she does not drink alcohol or use drugs.  Allergies:  Allergies  Allergen Reactions  . Augmentin [Amoxicillin-Pot Clavulanate] Nausea And Vomiting    Vomiting   . Nsaids Other (See  Comments)    Uncoded Allergy. Allergen: steriods, Other Reaction: AGITATION & SLEEPLESNESS  . Adhesive [Tape]   . Erythromycin   . Latex Other (See Comments)    Redness and irritation  . Levaquin [Levofloxacin In D5w]     Rapid heart beat  . Lisinopril   . Neosporin [Neomycin-Bacitracin Zn-Polymyx]   . Nickel     Medications: I have reviewed the patient's current medications.  Results for orders placed or performed during the hospital encounter of 08/17/19 (from the past 48 hour(s))  Type and screen [place]     Status: None   Collection Time: 08/17/19 10:55 AM  Result Value Ref Range   ABO/RH(D) B POS    Antibody Screen NEG    Sample Expiration      08/20/2019,2359 Performed at [facility], [place] [address] [place], [place] [address]   ABO/Rh     Status: None (Preliminary result)   Collection Time: 08/17/19 10:55 AM  Result Value Ref Range   ABO/RH(D)      B POS Performed at [facility] [address], [place], [place] [address]   CBC with Differential/Platelet     Status: Abnormal   Collection Time: 08/17/19 10:56 AM  Result Value Ref Range  WBC 13.5 (H) 4.0 - 10.5 K/uL   RBC 5.17 (H) 3.87 - 5.11 MIL/uL   Hemoglobin 15.0 12.0 - 15.0 g/dL   HCT 44.7 36.0 - 46.0 %   MCV 86.5 80.0 - 100.0 fL   MCH 29.0 26.0 - 34.0 pg   MCHC 33.6 30.0 - 36.0 g/dL   RDW 12.4 11.5 - 15.5 %   Platelets 208 150 - 400 K/uL   nRBC 0.0 0.0 - 0.2 %   Neutrophils Relative % 88 %   Neutro Abs 11.9 (H) 1.7 - 7.7 K/uL   Lymphocytes Relative 7 %   Lymphs Abs 0.9 0.7 - 4.0 K/uL   Monocytes Relative 4 %   Monocytes Absolute 0.6 0.1 - 1.0 K/uL   Eosinophils Relative 0 %   Eosinophils Absolute 0.0 0.0 - 0.5 K/uL   Basophils Relative 0 %   Basophils Absolute 0.0 0.0 - 0.1 K/uL   Immature Granulocytes 1 %   Abs Immature Granulocytes 0.08 (H) 0.00 - 0.07 K/uL    Comment: Performed at [facility] [address], [place], [place] [address]  Comprehensive  metabolic panel     Status: Abnormal   Collection Time: 08/17/19 10:56 AM  Result Value Ref Range   Sodium 135 135 - 145 mmol/L   Potassium 3.4 (L) 3.5 - 5.1 mmol/L   Chloride 97 (L) 98 - 111 mmol/L   CO2 27 22 - 32 mmol/L   Glucose, Bld 131 (H) 70 - 99 mg/dL   BUN 12 8 - 23 mg/dL   Creatinine, Ser 0.81 0.44 - 1.00 mg/dL   Calcium 9.3 8.9 - 10.3 mg/dL   Total Protein 6.5 6.5 - 8.1 g/dL   Albumin 3.9 3.5 - 5.0 g/dL   AST 33 15 - 41 U/L   ALT 22 0 - 44 U/L   Alkaline Phosphatase 74 38 - 126 U/L   Total Bilirubin 1.7 (H) 0.3 - 1.2 mg/dL   GFR calc non Af Amer >60 >60 mL/min   GFR calc Af Amer >60 >60 mL/min   Anion gap 11 5 - 15    Comment: Performed at [facility], [place] [address] [place], [place] [address]  Lipase, blood     Status: Abnormal   Collection Time: 08/17/19 10:56 AM  Result Value Ref Range   Lipase 288 (H) 11 - 51 U/L    Comment: Performed at [facility], [place] [address] [place], [place] [address]    Ct Abdomen Pelvis W Contrast  Result Date: 08/17/2019 CLINICAL DATA:  Mid abdominal pain, nausea/vomiting EXAM: CT ABDOMEN AND PELVIS WITH CONTRAST TECHNIQUE: Multidetector CT imaging of the abdomen and pelvis was performed using the standard protocol following bolus administration of intravenous contrast. CONTRAST:  139mL OMNIPAQUE IOHEXOL 300 MG/ML  SOLN COMPARISON:  None. FINDINGS: Lower chest: Lung bases are essentially clear. Hepatobiliary: Liver is within normal limits. Gallbladder is unremarkable. No intrahepatic or extrahepatic ductal dilatation. Pancreas: Mild fluid/stranding along the inferior aspect of the pancreatic head/uncinate process, favored to be secondary to duodenal inflammatory changes (described below). Concomitant pancreatitis is also possible. Spleen: Within normal limits. Adrenals/Urinary Tract: Adrenals is within normal limits. Subcentimeter left upper pole renal cyst (series 3/image 26). Right kidney is within normal limits. No  hydronephrosis. Bladder is within normal limits. Stomach/Bowel: Stomach is within normal limits. 2.7 cm extraluminal gas/debris filled structure adjacent to the 2nd portion of the duodenum (series 3/image 36) which favors a duodenal diverticulum given suspected communication (  coronal image 29). Adjacent extraluminal gas suggest localized perforation/duodenal diverticulitis (coronal image 32), with surrounding inflammatory stranding, fluid, and mucosal edema (series 3/image 41). No evidence of bowel obstruction. Appendix is not discretely visualized. Sigmoid diverticulosis, without evidence of diverticulitis. Vascular/Lymphatic: No evidence of abdominal aortic aneurysm. Atherosclerotic calcifications of the abdominal aorta and branch vessels. No suspicious abdominopelvic lymphadenopathy. Reproductive: Uterus is within normal limits. Bilateral ovaries are within normal limits. Other: Localized fluid/gas, as described above. Otherwise, no abdominopelvic ascites. No drainable fluid. Musculoskeletal: Visualized osseous structures are within normal limits. IMPRESSION: Suspected duodenal diverticulitis with localized extraluminal gas and surrounding inflammatory stranding/fluid, as described above. Localized duodenal perforation is considered less likely. Mild fluid/stranding along the posterior aspect of the pancreatic head/uncinate process, favored to be secondary to duodenal inflammatory changes, although concomitant pancreatitis is also possible. No evidence of bowel obstruction. These results were called by telephone at the time of interpretation on 08/17/2019 at 1:30 pm to Dr. Pattricia Boss , who verbally acknowledged these results. Electronically Signed   By: Julian Hy M.D.   On: 08/17/2019 13:34   Dg Chest Port 1 View  Result Date: 08/17/2019 CLINICAL DATA:  Abdominal pain, nausea and vomiting since yesterday. Some chills. No chest complains. States she has had a high heart rate. No hx of heart or lung  problems. No previous surgeries. HTN. No diabetes. Nonsmoker. EXAM: PORTABLE CHEST 1 VIEW COMPARISON:  06/28/2011 FINDINGS: Heart size is normal. Lungs are clear. No pulmonary edema. No free intraperitoneal air beneath the diaphragm. IMPRESSION: No active disease. Electronically Signed   By: Nolon Nations M.D.   On: 08/17/2019 11:42    Review of Systems  Constitutional: Negative for chills and fever.  HENT: Negative for hearing loss.   Eyes: Negative for blurred vision and double vision.  Respiratory: Negative for cough and hemoptysis.   Cardiovascular: Negative for chest pain and palpitations.  Gastrointestinal: Positive for abdominal pain, nausea and vomiting.  Genitourinary: Negative for dysuria and urgency.  Musculoskeletal: Negative for myalgias and neck pain.  Skin: Negative for itching and rash.  Neurological: Negative for dizziness, tingling and headaches.  Endo/Heme/Allergies: Does not bruise/bleed easily.  Psychiatric/Behavioral: Negative for depression and suicidal ideas.   Blood pressure (!) 117/59, pulse 97, temperature 98 F (36.7 C), temperature source Oral, resp. rate 15, last menstrual period 08/25/2004, SpO2 98 %. Physical Exam  Vitals reviewed. Constitutional: She is oriented to person, place, and time. She appears well-developed and well-nourished.  HENT:  Head: Normocephalic and atraumatic.  Eyes: Pupils are equal, round, and reactive to light. Conjunctivae and EOM are normal.  Neck: Normal range of motion. Neck supple.  Cardiovascular: Normal rate and regular rhythm.  Respiratory: Effort normal and breath sounds normal.  GI: Soft. Bowel sounds are normal. She exhibits no distension. There is abdominal tenderness in the epigastric area. There is no rigidity and no guarding.  Musculoskeletal: Normal range of motion.  Neurological: She is alert and oriented to person, place, and time.  Skin: Skin is warm and dry.  Psychiatric: She has a normal mood and affect.  Her behavior is normal.    Assessment/Plan: 68 yo female with epigastric pain and findings concerning for contained duodenal diverticulum perforation. -admit to surgery -bowel rest -broad spectrum iv abx -pain control, antiemetic  Arta Bruce Kinsinger 08/17/2019, 2:34 PM

## 2019-08-18 ENCOUNTER — Inpatient Hospital Stay: Payer: Self-pay

## 2019-08-18 LAB — CBC
HCT: 38.7 % (ref 36.0–46.0)
Hemoglobin: 13.1 g/dL (ref 12.0–15.0)
MCH: 29.2 pg (ref 26.0–34.0)
MCHC: 33.9 g/dL (ref 30.0–36.0)
MCV: 86.2 fL (ref 80.0–100.0)
Platelets: 164 10*3/uL (ref 150–400)
RBC: 4.49 MIL/uL (ref 3.87–5.11)
RDW: 12.7 % (ref 11.5–15.5)
WBC: 9.7 10*3/uL (ref 4.0–10.5)
nRBC: 0 % (ref 0.0–0.2)

## 2019-08-18 LAB — COMPREHENSIVE METABOLIC PANEL
ALT: 17 U/L (ref 0–44)
AST: 25 U/L (ref 15–41)
Albumin: 3.2 g/dL — ABNORMAL LOW (ref 3.5–5.0)
Alkaline Phosphatase: 62 U/L (ref 38–126)
Anion gap: 9 (ref 5–15)
BUN: 11 mg/dL (ref 8–23)
CO2: 25 mmol/L (ref 22–32)
Calcium: 8.5 mg/dL — ABNORMAL LOW (ref 8.9–10.3)
Chloride: 101 mmol/L (ref 98–111)
Creatinine, Ser: 0.86 mg/dL (ref 0.44–1.00)
GFR calc Af Amer: >60 mL/min (ref >60)
GFR calc non Af Amer: >60 mL/min (ref >60)
Glucose, Bld: 149 mg/dL — ABNORMAL HIGH (ref 70–99)
Potassium: 3.2 mmol/L — ABNORMAL LOW (ref 3.5–5.1)
Sodium: 135 mmol/L (ref 135–145)
Total Bilirubin: 1.2 mg/dL (ref 0.3–1.2)
Total Protein: 5.8 g/dL — ABNORMAL LOW (ref 6.5–8.1)

## 2019-08-18 MED ORDER — POTASSIUM CHLORIDE 10 MEQ/100ML IV SOLN
10.0000 meq | INTRAVENOUS | Status: AC
  Administered 2019-08-18 (×4): 10 meq via INTRAVENOUS
  Filled 2019-08-18 (×4): qty 100

## 2019-08-18 MED ORDER — SODIUM CHLORIDE 0.9% FLUSH: 10.0000 mL | INTRAVENOUS | Status: DC | PRN

## 2019-08-18 MED ORDER — POTASSIUM CHLORIDE 2 MEQ/ML IV SOLN
INTRAVENOUS | Status: DC
  Filled 2019-08-18 (×2): qty 1000

## 2019-08-18 MED ORDER — SODIUM CHLORIDE 0.9 % IV SOLN
8.0000 mg/h | INTRAVENOUS | Status: AC
  Administered 2019-08-18 – 2019-08-21 (×6): 8 mg/h via INTRAVENOUS
  Filled 2019-08-18 (×8): qty 80

## 2019-08-18 MED ORDER — SODIUM CHLORIDE 0.9% FLUSH
10.0000 mL | Freq: Two times a day (BID) | INTRAVENOUS | Status: DC
  Administered 2019-08-20: 10 mL

## 2019-08-18 MED ORDER — PANTOPRAZOLE SODIUM 40 MG IV SOLR
40.0000 mg | Freq: Two times a day (BID) | INTRAVENOUS | Status: DC
  Administered 2019-08-21: 40 mg via INTRAVENOUS
  Filled 2019-08-18: qty 40

## 2019-08-18 MED ORDER — KCL IN DEXTROSE-NACL 20-5-0.45 MEQ/L-%-% IV SOLN
INTRAVENOUS | Status: DC
  Administered 2019-08-18 – 2019-08-22 (×8): via INTRAVENOUS
  Filled 2019-08-18 (×10): qty 1000

## 2019-08-18 MED ORDER — SODIUM CHLORIDE 0.9 % IV SOLN
80.0000 mg | Freq: Once | INTRAVENOUS | Status: AC
  Administered 2019-08-18: 80 mg via INTRAVENOUS
  Filled 2019-08-18: qty 80

## 2019-08-18 NOTE — Progress Notes (Signed)
Peripherally Inserted Central Catheter/Midline Placement  The IV Nurse has discussed with the patient and/or persons authorized to consent for the patient, the purpose of this procedure and the potential benefits and risks involved with this procedure.  The benefits include less needle sticks, lab draws from the catheter, and the patient may be discharged home with the catheter. Risks include, but not limited to, infection, bleeding, blood clot (thrombus formation), and puncture of an artery; nerve damage and irregular heartbeat and possibility to perform a PICC exchange if needed/ordered by physician.  Alternatives to this procedure were also discussed.  Bard Power PICC patient education guide, fact sheet on infection prevention and patient information card has been provided to patient /or left at bedside.    PICC/Midline Placement Documentation  PICC Double Lumen [id number] PICC Right Basilic 37 cm (Active)  Indication for Insertion or Continuance of Line Poor Vasculature-patient has had multiple peripheral attempts or PIVs lasting less than 24 hours 08/18/19 1400  Site Assessment Clean;Dry;Intact 08/18/19 1400  Lumen #1 Status Flushed;Blood return noted 08/18/19 1400  Lumen #2 Status Flushed;Blood return noted 08/18/19 1400  Dressing Type Transparent 08/18/19 1400  Dressing Status Clean;Dry;Intact;Antimicrobial disc in place 08/18/19 1400  Dressing Change Due 08/25/19 08/18/19 1400       Jule Economy Horton 08/18/2019, 2:09 PM

## 2019-08-18 NOTE — Progress Notes (Signed)
Patient ID: Denise Young, female   DOB: [birth year], 68 y.o.   MRN: [id number]       Subjective: Patient doing better today, but more pain in lower abdomen than upper.  States it mostly feels like gas pains.  Still with some back pain.  Pain controlled with tylenol right now.  Objective: Vital signs in last 24 hours: Temp:  [98 F (36.7 C)-99.1 F (37.3 C)] 99.1 F (37.3 C) (08/24 0500) Pulse Rate:  [90-105] 105 (08/24 0500) Resp:  [13-18] 18 (08/24 0500) BP: (117-143)/(54-63) 124/54 (08/24 0500) SpO2:  [98 %-100 %] 99 % (08/24 0500) Weight:  [57 kg] 57 kg (08/23 1551) Last BM Date: 08/17/19  Intake/Output from previous day: 08/23 0701 - 08/24 0700 In: 537.8 [I.V.:37.8; IV Piggyback:500] Out: -  Intake/Output this shift: No intake/output data recorded.  PE: Heart: regular Lungs: CTAB Abd: soft, mildly tender in upper and lower abdomen, no guarding or peritonitis, +BS, mild lower abdominal bloating  Lab Results:  Recent Labs    08/17/19 1618 08/18/19 0213  WBC 11.8* 9.7  HGB 14.6 13.1  HCT 43.7 38.7  PLT 184 164   BMET Recent Labs    08/17/19 1056 08/17/19 1618 08/18/19 0213  NA 135  --  135  K 3.4*  --  3.2*  CL 97*  --  101  CO2 27  --  25  GLUCOSE 131*  --  149*  BUN 12  --  11  CREATININE 0.81 0.77 0.86  CALCIUM 9.3  --  8.5*   PT/INR No results for input(s): LABPROT, INR in the last 72 hours. CMP     Component Value Date/Time   NA 135 08/18/2019 0213   K 3.2 (L) 08/18/2019 0213   CL 101 08/18/2019 0213   CO2 25 08/18/2019 0213   GLUCOSE 149 (H) 08/18/2019 0213   BUN 11 08/18/2019 0213   CREATININE 0.86 08/18/2019 0213   CALCIUM 8.5 (L) 08/18/2019 0213   PROT 5.8 (L) 08/18/2019 0213   ALBUMIN 3.2 (L) 08/18/2019 0213   AST 25 08/18/2019 0213   ALT 17 08/18/2019 0213   ALKPHOS 62 08/18/2019 0213   BILITOT 1.2 08/18/2019 0213   GFRNONAA >60 08/18/2019 0213   GFRAA >60 08/18/2019 0213   Lipase     Component Value Date/Time   LIPASE 288  (H) 08/17/2019 1056       Studies/Results: Ct Abdomen Pelvis W Contrast  Result Date: 08/17/2019 CLINICAL DATA:  Mid abdominal pain, nausea/vomiting EXAM: CT ABDOMEN AND PELVIS WITH CONTRAST TECHNIQUE: Multidetector CT imaging of the abdomen and pelvis was performed using the standard protocol following bolus administration of intravenous contrast. CONTRAST:  173mL OMNIPAQUE IOHEXOL 300 MG/ML  SOLN COMPARISON:  None. FINDINGS: Lower chest: Lung bases are essentially clear. Hepatobiliary: Liver is within normal limits. Gallbladder is unremarkable. No intrahepatic or extrahepatic ductal dilatation. Pancreas: Mild fluid/stranding along the inferior aspect of the pancreatic head/uncinate process, favored to be secondary to duodenal inflammatory changes (described below). Concomitant pancreatitis is also possible. Spleen: Within normal limits. Adrenals/Urinary Tract: Adrenals is within normal limits. Subcentimeter left upper pole renal cyst (series 3/image 26). Right kidney is within normal limits. No hydronephrosis. Bladder is within normal limits. Stomach/Bowel: Stomach is within normal limits. 2.7 cm extraluminal gas/debris filled structure adjacent to the 2nd portion of the duodenum (series 3/image 36) which favors a duodenal diverticulum given suspected communication (coronal image 29). Adjacent extraluminal gas suggest localized perforation/duodenal diverticulitis (coronal image 32), with surrounding inflammatory stranding, fluid,  and mucosal edema (series 3/image 41). No evidence of bowel obstruction. Appendix is not discretely visualized. Sigmoid diverticulosis, without evidence of diverticulitis. Vascular/Lymphatic: No evidence of abdominal aortic aneurysm. Atherosclerotic calcifications of the abdominal aorta and branch vessels. No suspicious abdominopelvic lymphadenopathy. Reproductive: Uterus is within normal limits. Bilateral ovaries are within normal limits. Other: Localized fluid/gas, as described  above. Otherwise, no abdominopelvic ascites. No drainable fluid. Musculoskeletal: Visualized osseous structures are within normal limits. IMPRESSION: Suspected duodenal diverticulitis with localized extraluminal gas and surrounding inflammatory stranding/fluid, as described above. Localized duodenal perforation is considered less likely. Mild fluid/stranding along the posterior aspect of the pancreatic head/uncinate process, favored to be secondary to duodenal inflammatory changes, although concomitant pancreatitis is also possible. No evidence of bowel obstruction. These results were called by telephone at the time of interpretation on 08/17/2019 at 1:30 pm to Dr. Pattricia Boss , who verbally acknowledged these results. Electronically Signed   By: Julian Hy M.D.   On: 08/17/2019 13:34   Dg Chest Port 1 View  Result Date: 08/17/2019 CLINICAL DATA:  Abdominal pain, nausea and vomiting since yesterday. Some chills. No chest complains. States she has had a high heart rate. No hx of heart or lung problems. No previous surgeries. HTN. No diabetes. Nonsmoker. EXAM: PORTABLE CHEST 1 VIEW COMPARISON:  06/28/2011 FINDINGS: Heart size is normal. Lungs are clear. No pulmonary edema. No free intraperitoneal air beneath the diaphragm. IMPRESSION: No active disease. Electronically Signed   By: Nolon Nations M.D.   On: 08/17/2019 11:42    Anti-infectives: Anti-infectives (From admission, onward)   Start     Dose/Rate Route Frequency Ordered Stop   08/17/19 2100  piperacillin-tazobactam (ZOSYN) IVPB 3.375 g     3.375 g 12.5 mL/hr over 240 Minutes Intravenous Every 8 hours 08/17/19 1444     08/17/19 1445  piperacillin-tazobactam (ZOSYN) IVPB 3.375 g     3.375 g 100 mL/hr over 30 Minutes Intravenous  Once 08/17/19 1444 08/17/19 1657   08/17/19 1345  ceFEPIme (MAXIPIME) 2 g in sodium chloride 0.9 % 100 mL IVPB  Status:  Discontinued     2 g 200 mL/hr over 30 Minutes Intravenous  Once 08/17/19 1333 08/17/19  1442   08/17/19 1345  metroNIDAZOLE (FLAGYL) IVPB 500 mg  Status:  Discontinued     500 mg 100 mL/hr over 60 Minutes Intravenous  Once 08/17/19 1333 08/17/19 1442       Assessment/Plan Hypokalemia - replace today, recheck BMET in am  Duodenal diverticulum perforation with small RTP fluid collection -pain and WBC improving -cont NPO and bowel rest.  Will need UGI prior to initiation of diet or liquids -cont zosyn -mobilize and pulm toilet  FEN - NPO/IVFs VTE - Lovenox ID - zosyn   LOS: 1 day    Henreitta Cea , [facility] 08/18/2019, 8:44 AM Pager: [phone]

## 2019-08-19 ENCOUNTER — Encounter (HOSPITAL_COMMUNITY): Payer: Self-pay | Admitting: General Practice

## 2019-08-19 LAB — CBC
HCT: 33.8 % — ABNORMAL LOW (ref 36.0–46.0)
Hemoglobin: 10.9 g/dL — ABNORMAL LOW (ref 12.0–15.0)
MCH: 29.1 pg (ref 26.0–34.0)
MCHC: 32.2 g/dL (ref 30.0–36.0)
MCV: 90.4 fL (ref 80.0–100.0)
Platelets: 123 10*3/uL — ABNORMAL LOW (ref 150–400)
RBC: 3.74 MIL/uL — ABNORMAL LOW (ref 3.87–5.11)
RDW: 12.8 % (ref 11.5–15.5)
WBC: 6.6 10*3/uL (ref 4.0–10.5)
nRBC: 0 % (ref 0.0–0.2)

## 2019-08-19 LAB — BASIC METABOLIC PANEL
Anion gap: 5 (ref 5–15)
BUN: 6 mg/dL — ABNORMAL LOW (ref 8–23)
CO2: 25 mmol/L (ref 22–32)
Calcium: 8 mg/dL — ABNORMAL LOW (ref 8.9–10.3)
Chloride: 106 mmol/L (ref 98–111)
Creatinine, Ser: 0.76 mg/dL (ref 0.44–1.00)
GFR calc Af Amer: >60 mL/min (ref >60)
GFR calc non Af Amer: >60 mL/min (ref >60)
Glucose, Bld: 123 mg/dL — ABNORMAL HIGH (ref 70–99)
Potassium: 3.5 mmol/L (ref 3.5–5.1)
Sodium: 136 mmol/L (ref 135–145)

## 2019-08-19 LAB — HIV ANTIBODY (ROUTINE TESTING W REFLEX): HIV Screen 4th Generation wRfx: NONREACTIVE

## 2019-08-19 MED ORDER — POTASSIUM CHLORIDE 10 MEQ/100ML IV SOLN
10.0000 meq | INTRAVENOUS | Status: AC
  Administered 2019-08-19 (×4): 10 meq via INTRAVENOUS
  Filled 2019-08-19 (×4): qty 100

## 2019-08-19 MED ORDER — LORAZEPAM 2 MG/ML IJ SOLN
0.5000 mg | Freq: Four times a day (QID) | INTRAMUSCULAR | Status: DC | PRN
  Administered 2019-08-19 – 2019-08-20 (×2): 0.5 mg via INTRAVENOUS
  Filled 2019-08-19 (×2): qty 1

## 2019-08-19 NOTE — Progress Notes (Signed)
Called by RN to patients room stating that PICC was leaking. Patient states that it seems to be leaking where IV tubing connects to the PICC line. Caps and tubing have a good connection. Caps to both lumens changed out and flushed with no leaking. IV fluids connected to PICC, observed for several minutes with no leaking. Dressing is clean, dry, and intact. RN can place another consult if any more issues.

## 2019-08-19 NOTE — Progress Notes (Signed)
Patient ID: Denise Young, female   DOB: 11-09-1951, 68 y.o.   MRN: [id number]       Subjective: Had quite a bit of pain yesterday after I saw her.  Feels better today, but still having pain.  Less bloated across lower abdomen and passed a lot of flatus.  Would like some valium/ativan like she gets at home very rarely.  This helps with her pain due to fibromyalgia.  No nausea.  Got picc line yesterday due to burning from K and IV blew.  Objective: Vital signs in last 24 hours: Temp:  [98.2 F (36.8 C)-99.5 F (37.5 C)] 98.2 F (36.8 C) (08/25 0501) Pulse Rate:  [97-101] 99 (08/25 0501) Resp:  [16-18] 17 (08/25 0501) BP: (118-126)/(57-68) 121/57 (08/25 0501) SpO2:  [97 %-99 %] 99 % (08/25 0501) Last BM Date: 08/17/19  Intake/Output from previous day: 08/24 0701 - 08/25 0700 In: 4100.2 [I.V.:3695.4; IV Piggyback:404.8] Out: -  Intake/Output this shift: No intake/output data recorded.  PE: Heart: Regular Lungs: CTAB Abd: soft, less bloated in lower abdomen, +BS, tender diffusely, but mild, no guarding or peritonitis   Lab Results:  Recent Labs    08/18/19 0213 08/19/19 0343  WBC 9.7 6.6  HGB 13.1 10.9*  HCT 38.7 33.8*  PLT 164 123*   BMET Recent Labs    08/18/19 0213 08/19/19 0343  NA 135 136  K 3.2* 3.5  CL 101 106  CO2 25 25  GLUCOSE 149* 123*  BUN 11 6*  CREATININE 0.86 0.76  CALCIUM 8.5* 8.0*   PT/INR No results for input(s): LABPROT, INR in the last 72 hours. CMP     Component Value Date/Time   NA 136 08/19/2019 0343   K 3.5 08/19/2019 0343   CL 106 08/19/2019 0343   CO2 25 08/19/2019 0343   GLUCOSE 123 (H) 08/19/2019 0343   BUN 6 (L) 08/19/2019 0343   CREATININE 0.76 08/19/2019 0343   CALCIUM 8.0 (L) 08/19/2019 0343   PROT 5.8 (L) 08/18/2019 0213   ALBUMIN 3.2 (L) 08/18/2019 0213   AST 25 08/18/2019 0213   ALT 17 08/18/2019 0213   ALKPHOS 62 08/18/2019 0213   BILITOT 1.2 08/18/2019 0213   GFRNONAA >60 08/19/2019 0343   GFRAA >60  08/19/2019 0343   Lipase     Component Value Date/Time   LIPASE 288 (H) 08/17/2019 1056       Studies/Results: Ct Abdomen Pelvis W Contrast  Result Date: 08/17/2019 CLINICAL DATA:  Mid abdominal pain, nausea/vomiting EXAM: CT ABDOMEN AND PELVIS WITH CONTRAST TECHNIQUE: Multidetector CT imaging of the abdomen and pelvis was performed using the standard protocol following bolus administration of intravenous contrast. CONTRAST:  147mL OMNIPAQUE IOHEXOL 300 MG/ML  SOLN COMPARISON:  None. FINDINGS: Lower chest: Lung bases are essentially clear. Hepatobiliary: Liver is within normal limits. Gallbladder is unremarkable. No intrahepatic or extrahepatic ductal dilatation. Pancreas: Mild fluid/stranding along the inferior aspect of the pancreatic head/uncinate process, favored to be secondary to duodenal inflammatory changes (described below). Concomitant pancreatitis is also possible. Spleen: Within normal limits. Adrenals/Urinary Tract: Adrenals is within normal limits. Subcentimeter left upper pole renal cyst (series 3/image 26). Right kidney is within normal limits. No hydronephrosis. Bladder is within normal limits. Stomach/Bowel: Stomach is within normal limits. 2.7 cm extraluminal gas/debris filled structure adjacent to the 2nd portion of the duodenum (series 3/image 36) which favors a duodenal diverticulum given suspected communication (coronal image 29). Adjacent extraluminal gas suggest localized perforation/duodenal diverticulitis (coronal image 32), with surrounding inflammatory  stranding, fluid, and mucosal edema (series 3/image 41). No evidence of bowel obstruction. Appendix is not discretely visualized. Sigmoid diverticulosis, without evidence of diverticulitis. Vascular/Lymphatic: No evidence of abdominal aortic aneurysm. Atherosclerotic calcifications of the abdominal aorta and branch vessels. No suspicious abdominopelvic lymphadenopathy. Reproductive: Uterus is within normal limits. Bilateral  ovaries are within normal limits. Other: Localized fluid/gas, as described above. Otherwise, no abdominopelvic ascites. No drainable fluid. Musculoskeletal: Visualized osseous structures are within normal limits. IMPRESSION: Suspected duodenal diverticulitis with localized extraluminal gas and surrounding inflammatory stranding/fluid, as described above. Localized duodenal perforation is considered less likely. Mild fluid/stranding along the posterior aspect of the pancreatic head/uncinate process, favored to be secondary to duodenal inflammatory changes, although concomitant pancreatitis is also possible. No evidence of bowel obstruction. These results were called by telephone at the time of interpretation on 08/17/2019 at 1:30 pm to Dr. Pattricia Boss , who verbally acknowledged these results. Electronically Signed   By: Julian Hy M.D.   On: 08/17/2019 13:34   Dg Chest Port 1 View  Result Date: 08/17/2019 CLINICAL DATA:  Abdominal pain, nausea and vomiting since yesterday. Some chills. No chest complains. States she has had a high heart rate. No hx of heart or lung problems. No previous surgeries. HTN. No diabetes. Nonsmoker. EXAM: PORTABLE CHEST 1 VIEW COMPARISON:  06/28/2011 FINDINGS: Heart size is normal. Lungs are clear. No pulmonary edema. No free intraperitoneal air beneath the diaphragm. IMPRESSION: No active disease. Electronically Signed   By: Nolon Nations M.D.   On: 08/17/2019 11:42   [place] Ekg Site Rite  Result Date: 08/18/2019 If Site Rite image not attached, placement could not be confirmed due to current cardiac rhythm.   Anti-infectives: Anti-infectives (From admission, onward)   Start     Dose/Rate Route Frequency Ordered Stop   08/17/19 2100  piperacillin-tazobactam (ZOSYN) IVPB 3.375 g     3.375 g 12.5 mL/hr over 240 Minutes Intravenous Every 8 hours 08/17/19 1444     08/17/19 1445  piperacillin-tazobactam (ZOSYN) IVPB 3.375 g     3.375 g 100 mL/hr over 30 Minutes  Intravenous  Once 08/17/19 1444 08/17/19 1657   08/17/19 1345  ceFEPIme (MAXIPIME) 2 g in sodium chloride 0.9 % 100 mL IVPB  Status:  Discontinued     2 g 200 mL/hr over 30 Minutes Intravenous  Once 08/17/19 1333 08/17/19 1442   08/17/19 1345  metroNIDAZOLE (FLAGYL) IVPB 500 mg  Status:  Discontinued     500 mg 100 mL/hr over 60 Minutes Intravenous  Once 08/17/19 1333 08/17/19 1442       Assessment/Plan Fibromyalgia - ativan prn, valium on national backorder Hypokalemia - replace today, recheck BMET in am  Duodenal diverticulum perforation with small RTP fluid collection -pain and WBC improving -cont NPO and bowel rest.  Will need UGI prior to initiation of diet or liquids -cont zosyn -mobilize and pulm toilet  FEN - NPO/IVFs VTE - Lovenox ID - zosyn   LOS: 2 days    Henreitta Cea , [facility] 08/19/2019, 9:22 AM Pager: [phone]

## 2019-08-20 LAB — CBC
HCT: 32 % — ABNORMAL LOW (ref 36.0–46.0)
Hemoglobin: 10.3 g/dL — ABNORMAL LOW (ref 12.0–15.0)
MCH: 29.4 pg (ref 26.0–34.0)
MCHC: 32.2 g/dL (ref 30.0–36.0)
MCV: 91.4 fL (ref 80.0–100.0)
Platelets: 128 10*3/uL — ABNORMAL LOW (ref 150–400)
RBC: 3.5 MIL/uL — ABNORMAL LOW (ref 3.87–5.11)
RDW: 13 % (ref 11.5–15.5)
WBC: 5.7 10*3/uL (ref 4.0–10.5)
nRBC: 0 % (ref 0.0–0.2)

## 2019-08-20 LAB — BASIC METABOLIC PANEL
Anion gap: 6 (ref 5–15)
BUN: 5 mg/dL — ABNORMAL LOW (ref 8–23)
CO2: 25 mmol/L (ref 22–32)
Calcium: 8.2 mg/dL — ABNORMAL LOW (ref 8.9–10.3)
Chloride: 106 mmol/L (ref 98–111)
Creatinine, Ser: 0.79 mg/dL (ref 0.44–1.00)
GFR calc Af Amer: >60 mL/min (ref >60)
GFR calc non Af Amer: >60 mL/min (ref >60)
Glucose, Bld: 106 mg/dL — ABNORMAL HIGH (ref 70–99)
Potassium: 3.6 mmol/L (ref 3.5–5.1)
Sodium: 137 mmol/L (ref 135–145)

## 2019-08-20 MED ORDER — ACETAMINOPHEN 500 MG PO TABS
1000.0000 mg | ORAL_TABLET | Freq: Four times a day (QID) | ORAL | Status: DC | PRN
  Administered 2019-08-20 – 2019-08-23 (×5): 1000 mg via ORAL
  Filled 2019-08-20 (×4): qty 2

## 2019-08-20 NOTE — Progress Notes (Signed)
Patient ID: LIISA THURSTON, female   DOB: October 25, 1951, 68 y.o.   MRN: [id number]       Subjective: Patient continues to feel better.  Still with some pain, but improving.  Slept much better overnight with ativan.  No nausea.  Passing flatus.  Makes lower abdominal pain better when passing flatus c/w gas pains.  Objective: Vital signs in last 24 hours: Temp:  [98.2 F (36.8 C)-99.6 F (37.6 C)] 98.2 F (36.8 C) (08/26 0524) Pulse Rate:  [91-101] 101 (08/26 0524) Resp:  [16-18] 17 (08/26 0524) BP: (125-138)/(63-65) 125/63 (08/26 0524) SpO2:  [98 %-100 %] 98 % (08/26 0524) Last BM Date: 08/19/19  Intake/Output from previous day: 08/25 0701 - 08/26 0700 In: 3572.8 [I.V.:2972.8; IV Piggyback:600] Out: -  Intake/Output this shift: No intake/output data recorded.  PE: Heart: regular Lungs: CTAB Abd: soft, much less tender, but still slightly so in RUQ, +BS, ND  Lab Results:  Recent Labs    08/19/19 0343 08/20/19 0407  WBC 6.6 5.7  HGB 10.9* 10.3*  HCT 33.8* 32.0*  PLT 123* 128*   BMET Recent Labs    08/19/19 0343 08/20/19 0407  NA 136 137  K 3.5 3.6  CL 106 106  CO2 25 25  GLUCOSE 123* 106*  BUN 6* 5*  CREATININE 0.76 0.79  CALCIUM 8.0* 8.2*   PT/INR No results for input(s): LABPROT, INR in the last 72 hours. CMP     Component Value Date/Time   NA 137 08/20/2019 0407   K 3.6 08/20/2019 0407   CL 106 08/20/2019 0407   CO2 25 08/20/2019 0407   GLUCOSE 106 (H) 08/20/2019 0407   BUN 5 (L) 08/20/2019 0407   CREATININE 0.79 08/20/2019 0407   CALCIUM 8.2 (L) 08/20/2019 0407   PROT 5.8 (L) 08/18/2019 0213   ALBUMIN 3.2 (L) 08/18/2019 0213   AST 25 08/18/2019 0213   ALT 17 08/18/2019 0213   ALKPHOS 62 08/18/2019 0213   BILITOT 1.2 08/18/2019 0213   GFRNONAA >60 08/20/2019 0407   GFRAA >60 08/20/2019 0407   Lipase     Component Value Date/Time   LIPASE 288 (H) 08/17/2019 1056       Studies/Results: [place] Ekg Site Rite  Result Date: 08/18/2019 If  Site Rite image not attached, placement could not be confirmed due to current cardiac rhythm.   Anti-infectives: Anti-infectives (From admission, onward)   Start     Dose/Rate Route Frequency Ordered Stop   08/17/19 2100  piperacillin-tazobactam (ZOSYN) IVPB 3.375 g     3.375 g 12.5 mL/hr over 240 Minutes Intravenous Every 8 hours 08/17/19 1444     08/17/19 1445  piperacillin-tazobactam (ZOSYN) IVPB 3.375 g     3.375 g 100 mL/hr over 30 Minutes Intravenous  Once 08/17/19 1444 08/17/19 1657   08/17/19 1345  ceFEPIme (MAXIPIME) 2 g in sodium chloride 0.9 % 100 mL IVPB  Status:  Discontinued     2 g 200 mL/hr over 30 Minutes Intravenous  Once 08/17/19 1333 08/17/19 1442   08/17/19 1345  metroNIDAZOLE (FLAGYL) IVPB 500 mg  Status:  Discontinued     500 mg 100 mL/hr over 60 Minutes Intravenous  Once 08/17/19 1333 08/17/19 1442       Assessment/Plan Fibromyalgia - ativan prn, valium on national backorder Hypokalemia - 3.6  Duodenal diverticulum perforation with small RTP fluid collection -pain and WBC improving -cont NPO and bowel rest. Will do UGI tomorrow morning to eval for leak. -cont zosyn -mobilize and pulm  toilet  FEN -NPO/IVFs VTE -Lovenox ID -zosyn   LOS: 3 days    Henreitta Cea , [facility] 08/20/2019, 9:19 AM Pager: [phone]

## 2019-08-21 ENCOUNTER — Inpatient Hospital Stay (HOSPITAL_COMMUNITY): Payer: Medicare Other

## 2019-08-21 MED ORDER — IOHEXOL 300 MG/ML  SOLN
150.0000 mL | Freq: Once | INTRAMUSCULAR | Status: AC | PRN
  Administered 2019-08-21: 75 mL via ORAL

## 2019-08-21 NOTE — TOC Initial Note (Signed)
Transition of Care [facility]) - Initial/Assessment Note    Patient Details  Name: Denise Young MRN: [id number] Date of Birth: 09-Jun-1951  Transition of Care [facility]) CM/SW Contact:    Marilu Favre, RN Phone Number: 08/21/2019, 10:01 AM  Clinical Narrative:                  From home with husband, PA note :  if UGI negative will start clear liquids today   Will continue to follow Expected Discharge Plan: Home/Self Care Barriers to Discharge: Continued Medical Work up   Patient Goals and CMS Choice Patient states their goals for this hospitalization and ongoing recovery are:: to go home CMS Medicare.gov Compare Post Acute Care list provided to:: Patient Choice offered to / list presented to : NA  Expected Discharge Plan and Services Expected Discharge Plan: Home/Self Care       Living arrangements for the past 2 months: Single Family Home                 DME Arranged: N/A         HH Arranged: NA          Prior Living Arrangements/Services Living arrangements for the past 2 months: Single Family Home Lives with:: Spouse Patient language and need for interpreter reviewed:: Yes Do you feel safe going back to the place where you live?: Yes      Need for Family Participation in Patient Care: Yes (Comment) Care giver support system in place?: Yes (comment)   Criminal Activity/Legal Involvement Pertinent to Current Situation/Hospitalization: No - Comment as needed  Activities of Daily Living      Permission Sought/Granted   Permission granted to share information with : No              Emotional Assessment Appearance:: Appears stated age Attitude/Demeanor/Rapport: Engaged Affect (typically observed): Accepting Orientation: : Oriented to Self, Oriented to Place, Oriented to  Time, Oriented to Situation Alcohol / Substance Use: Not Applicable Psych Involvement: No (comment)  Admission diagnosis:  Perforated diverticulum of duodenum [K57.00] Acute  pancreatitis, unspecified complication status, unspecified pancreatitis type [K85.90] Patient Active Problem List   Diagnosis Date Noted  . Perforated diverticulum of duodenum 08/17/2019  . Essential hypertension 11/28/2016  . Psoriasis 09/21/2016  . Osteoporosis 04/01/2014  . Atrophic vaginitis 03/20/2013  . IBS 10/26/2010  . PALPITATIONS, RECURRENT 10/26/2010  . Thyroid nodule 10/04/2010  . FIBROMYALGIA 10/04/2010  . Disturbance in sleep behavior 10/04/2010  . ELEVATED BP READING WITHOUT DX HYPERTENSION 10/04/2010   PCP:  Velna Hatchet, MD Pharmacy:   [id number] - [place], [address] AT [address] [place]. [place] [address] Phone: [phone] Fax: [phone]     Social Determinants of Health (SDOH) Interventions    Readmission Risk Interventions No flowsheet data found.

## 2019-08-21 NOTE — Progress Notes (Signed)
Patient ID: Denise Young, female   DOB: 01-20-1951, 68 y.o.   MRN: [id number]       Subjective: Continues to feel much better.  Had 2 large BMs yesterday.  No nausea.  Almost no abdominal pain.  Objective: Vital signs in last 24 hours: Temp:  [98.1 F (36.7 C)-98.9 F (37.2 C)] 98.5 F (36.9 C) (08/27 0542) Pulse Rate:  [86-98] 98 (08/27 0542) Resp:  [17-18] 17 (08/27 0542) BP: (120-134)/(62-69) 134/69 (08/27 0542) SpO2:  [97 %-100 %] 97 % (08/27 0542) Last BM Date: 08/20/19  Intake/Output from previous day: 08/26 0701 - 08/27 0700 In: 1283 [I.V.:1183; IV Piggyback:100] Out: -  Intake/Output this shift: No intake/output data recorded.  PE: Heart: regular Lungs: CTAB Abd: soft, almost nontender, +BS, ND  Lab Results:  Recent Labs    08/19/19 0343 08/20/19 0407  WBC 6.6 5.7  HGB 10.9* 10.3*  HCT 33.8* 32.0*  PLT 123* 128*   BMET Recent Labs    08/19/19 0343 08/20/19 0407  NA 136 137  K 3.5 3.6  CL 106 106  CO2 25 25  GLUCOSE 123* 106*  BUN 6* 5*  CREATININE 0.76 0.79  CALCIUM 8.0* 8.2*   PT/INR No results for input(s): LABPROT, INR in the last 72 hours. CMP     Component Value Date/Time   NA 137 08/20/2019 0407   K 3.6 08/20/2019 0407   CL 106 08/20/2019 0407   CO2 25 08/20/2019 0407   GLUCOSE 106 (H) 08/20/2019 0407   BUN 5 (L) 08/20/2019 0407   CREATININE 0.79 08/20/2019 0407   CALCIUM 8.2 (L) 08/20/2019 0407   PROT 5.8 (L) 08/18/2019 0213   ALBUMIN 3.2 (L) 08/18/2019 0213   AST 25 08/18/2019 0213   ALT 17 08/18/2019 0213   ALKPHOS 62 08/18/2019 0213   BILITOT 1.2 08/18/2019 0213   GFRNONAA >60 08/20/2019 0407   GFRAA >60 08/20/2019 0407   Lipase     Component Value Date/Time   LIPASE 288 (H) 08/17/2019 1056       Studies/Results: No results found.  Anti-infectives: Anti-infectives (From admission, onward)   Start     Dose/Rate Route Frequency Ordered Stop   08/17/19 2100  piperacillin-tazobactam (ZOSYN) IVPB 3.375 g     3.375 g 12.5 mL/hr over 240 Minutes Intravenous Every 8 hours 08/17/19 1444     08/17/19 1445  piperacillin-tazobactam (ZOSYN) IVPB 3.375 g     3.375 g 100 mL/hr over 30 Minutes Intravenous  Once 08/17/19 1444 08/17/19 1657   08/17/19 1345  ceFEPIme (MAXIPIME) 2 g in sodium chloride 0.9 % 100 mL IVPB  Status:  Discontinued     2 g 200 mL/hr over 30 Minutes Intravenous  Once 08/17/19 1333 08/17/19 1442   08/17/19 1345  metroNIDAZOLE (FLAGYL) IVPB 500 mg  Status:  Discontinued     500 mg 100 mL/hr over 60 Minutes Intravenous  Once 08/17/19 1333 08/17/19 1442       Assessment/Plan Fibromyalgia - ativan prn, valium on national backorder Hypokalemia - resolved  Duodenal diverticulum perforation with small RTP fluid collection -UGI today.  If negative will plan for CLD as patient is feeling significantly better -cont zosyn -mobilize and pulm toilet  FEN -NPO/IVFs VTE -Lovenox ID -zosyn   LOS: 4 days    Henreitta Cea , [facility] 08/21/2019, 8:23 AM Pager: [phone]

## 2019-08-22 MED ORDER — DIAZEPAM 5 MG PO TABS
5.0000 mg | ORAL_TABLET | Freq: Every day | ORAL | Status: DC | PRN
  Administered 2019-08-22: 5 mg via ORAL
  Filled 2019-08-22: qty 1

## 2019-08-22 MED ORDER — PROPRANOLOL HCL 20 MG PO TABS
10.0000 mg | ORAL_TABLET | Freq: Every day | ORAL | Status: DC
  Administered 2019-08-22 – 2019-08-23 (×2): 10 mg via ORAL
  Filled 2019-08-22 (×2): qty 1

## 2019-08-22 MED ORDER — PANTOPRAZOLE SODIUM 40 MG PO TBEC
40.0000 mg | DELAYED_RELEASE_TABLET | Freq: Two times a day (BID) | ORAL | Status: DC
  Administered 2019-08-22 – 2019-08-23 (×3): 40 mg via ORAL
  Filled 2019-08-22 (×3): qty 1

## 2019-08-22 NOTE — Progress Notes (Signed)
Patient ID: Denise Young, female   DOB: December 19, 1951, 68 y.o.   MRN: [id number]       Subjective: Had a rough night due to copious diarrhea from contrast from UGI yesterday.  Otherwise no pain still.  Done well with liquids thus far.  Objective: Vital signs in last 24 hours: Temp:  [97.7 F (36.5 C)-98.7 F (37.1 C)] 97.7 F (36.5 C) (08/28 0500) Pulse Rate:  [82-88] 87 (08/28 0500) Resp:  [14-16] 16 (08/28 0500) BP: (128-141)/(69-86) 128/76 (08/28 0500) SpO2:  [97 %-99 %] 99 % (08/28 0500) Last BM Date: 08/21/19  Intake/Output from previous day: 08/27 0701 - 08/28 0700 In: 1227.8 [P.O.:200; I.V.:922.7; IV Piggyback:105.1] Out: 4 [Urine:4] Intake/Output this shift: No intake/output data recorded.  PE: Heart: regular Lungs: CTAB Abd: soft, NT, ND, +BS  Lab Results:  Recent Labs    08/20/19 0407  WBC 5.7  HGB 10.3*  HCT 32.0*  PLT 128*   BMET Recent Labs    08/20/19 0407  NA 137  K 3.6  CL 106  CO2 25  GLUCOSE 106*  BUN 5*  CREATININE 0.79  CALCIUM 8.2*   PT/INR No results for input(s): LABPROT, INR in the last 72 hours. CMP     Component Value Date/Time   NA 137 08/20/2019 0407   K 3.6 08/20/2019 0407   CL 106 08/20/2019 0407   CO2 25 08/20/2019 0407   GLUCOSE 106 (H) 08/20/2019 0407   BUN 5 (L) 08/20/2019 0407   CREATININE 0.79 08/20/2019 0407   CALCIUM 8.2 (L) 08/20/2019 0407   PROT 5.8 (L) 08/18/2019 0213   ALBUMIN 3.2 (L) 08/18/2019 0213   AST 25 08/18/2019 0213   ALT 17 08/18/2019 0213   ALKPHOS 62 08/18/2019 0213   BILITOT 1.2 08/18/2019 0213   GFRNONAA >60 08/20/2019 0407   GFRAA >60 08/20/2019 0407   Lipase     Component Value Date/Time   LIPASE 288 (H) 08/17/2019 1056       Studies/Results: Dg Abd Portable 1v  Result Date: 08/21/2019 CLINICAL DATA:  Duodenal diverticulum EXAM: PORTABLE ABDOMEN - 1 VIEW COMPARISON:  CT 08/17/2019.  Upper GI 08/21/2019 FINDINGS: Single AP supine view of the abdomen was performed. Enteric  contrast is noted throughout the colon. No definite residual contrast is identified within the small bowel or discrete duodenal diverticulum. No extraluminal collections of contrast are identified. No gross free intraperitoneal air. No dilated loops of small bowel. IMPRESSION: Nonobstructive bowel gas pattern. No extraluminal contrast identified. Electronically Signed   By: Davina Poke M.D.   On: 08/21/2019 15:23   Dg Duanne Limerick W Single Cm (sol Or Thin Ba)  Result Date: 08/21/2019 CLINICAL DATA:  Perforated duodenal diverticulum. EXAM: UPPER GI SERIES WITH KUB TECHNIQUE: After obtaining a scout radiograph a routine upper GI series was performed using 75 cc Omnipaque 300 FLUOROSCOPY TIME:  Fluoroscopy Time:  3 minutes, 36 seconds Radiation Exposure Index (if provided by the fluoroscopic device): 28.9 mGy Number of Acquired Spot Images: 0 COMPARISON:  CT scan 8/5 02/2019 FINDINGS: Initial KUB demonstrates no large quantity of free intraperitoneal gas. Bandlike subsegmental atelectasis medially in the left lower lobe. Gas and formed stool in the colon. No appreciable dilated bowel. The pharyngeal phase of swallowing was not assessed. We used water-soluble contrast due to the recent perforation. A central line projects over the SVC. Primary peristaltic waves were normal on 3/4 swallows, within normal limits. On a single swallow there was some disruption of the primary peristaltic wave  with secondary and tertiary contractions. This cleared with a dry swallow. No esophageal stricture identified. Using a balloon paddle and gentle compression on the stomach, no gastric abnormality is identified. With positioning of the patient in the right lateral decubitus positioning there was brisk emptying of contrast medium from the stomach into the duodenum. There is some widening of the duodenal C loop likely due to local inflammation. Medially along the descending duodenum in the vicinity of the known inflamed diverticulum, there  is abnormal thickening and irregularity of the duodenal folds indicating inflammation. After a few minutes, when contrasted not spontaneously extend into the duodenal diverticulum, I employed gentle compression of the area using a balloon paddle (series 14). Even with gentle compression/manipulation of the area using the balloon paddle, I was not able to fill the neck of the diverticulum or the diverticulum itself. On image 1/11 there is a small diverticulum of the proximal jejunum. Questionable additional jejunal diverticulum on image 20/12. IMPRESSION: 1. Abnormal fold thickening medially in the descending duodenum with widening of the duodenal C-loop indicating an inflammatory process in the region corresponding to that demonstrated on prior CT. No leak is demonstrated. Despite the use of gentle compression using a balloon paddle, I was not able to cause the diverticulum to fill with contrast medium. This may be from inflammation of the neck of the diverticulum. The implication is that we have not fully challenged the diverticulum with filling to test the integrity of the walls of the diverticulum. Depending on the patient's clinical course, repeat CT may be helpful in determining whether there has been improvement or worsening. 2. At least 1 and possibly 2 small noninflamed diverticula of the proximal jejunum. Electronically Signed   By: Van Clines M.D.   On: 08/21/2019 09:04    Anti-infectives: Anti-infectives (From admission, onward)   Start     Dose/Rate Route Frequency Ordered Stop   08/17/19 2100  piperacillin-tazobactam (ZOSYN) IVPB 3.375 g     3.375 g 12.5 mL/hr over 240 Minutes Intravenous Every 8 hours 08/17/19 1444     08/17/19 1445  piperacillin-tazobactam (ZOSYN) IVPB 3.375 g     3.375 g 100 mL/hr over 30 Minutes Intravenous  Once 08/17/19 1444 08/17/19 1657   08/17/19 1345  ceFEPIme (MAXIPIME) 2 g in sodium chloride 0.9 % 100 mL IVPB  Status:  Discontinued     2 g 200 mL/hr  over 30 Minutes Intravenous  Once 08/17/19 1333 08/17/19 1442   08/17/19 1345  metroNIDAZOLE (FLAGYL) IVPB 500 mg  Status:  Discontinued     500 mg 100 mL/hr over 60 Minutes Intravenous  Once 08/17/19 1333 08/17/19 1442       Assessment/Plan Fibromyalgia - restart oral valium Hypokalemia -resolved Heart palpitations - restart home propanolol.  Duodenal diverticulum perforation with small RTP fluid collection -UGI negative for leak.  Tolerating CLD.  Will adv to gluten free diet at the full liquid level.   -cont zosyn, will need to figure out what oral medicine to give her at discharge as she doesn't tolerate (Cipro/flagyl/augmentin) -change all other meds to oral -mobilize and pulm toilet  FEN -FLD/SLIV VTE -Lovenox ID -[email or web address]   LOS: 5 days    Henreitta Cea , [facility] 08/22/2019, 9:45 AM Pager: [phone]

## 2019-08-22 NOTE — Care Management Important Message (Signed)
Important Message  Patient Details  Name: Denise Young MRN: [id number] Date of Birth: December 25, 1951   Medicare Important Message Given:  Yes     Memory Argue 08/22/2019, 1:52 PM   PATIENT HAS SIGNED IM

## 2019-08-23 NOTE — Discharge Summary (Signed)
Physician Discharge Summary  Patient ID: Denise Young MRN: [id number] DOB/AGE: 67-Apr-1952 67 y.o.  PCP: Velna Hatchet, MD  Admit date: 08/17/2019 Discharge date: 08/23/2019  Admission Diagnoses:  Perforated duodenal diverticulum  Discharge Diagnoses:  same  Active Problems:   Perforated diverticulum of duodenum   Surgery:  none  Discharged Condition: improved  Hospital Course:   Presented with generalized midabdominal pain radiating into there back.  Found to have duodenal diverticulitis which responded to IV Zosyn.  She has a number of food and medicine intolerances.  She got much better and is discharged on Saturday with no pain meds or antibiotics.  She is to get back on her probiotic at home and should remain on soft diet and advance slowly.    Consults: none  Significant Diagnostic Studies: CT abdomen and subsequent UGI series revealing perforated diverticultis on the CT and no extravasation on the UGI    Discharge Exam: Blood pressure (!) 141/73, pulse 78, temperature 97.7 F (36.5 C), temperature source Oral, resp. rate 18, height 5\' 4"  (1.626 m), weight 57 kg, last menstrual period 08/25/2004, SpO2 98 %. Abdomen is soft and nontender  Disposition: Discharge disposition: 01-Home or Self Care       Discharge Instructions    Call MD for:  persistant nausea and vomiting   Complete by: As directed    Call MD for:  severe uncontrolled pain   Complete by: As directed    Diet - low sodium heart healthy   Complete by: As directed    Discharge instructions   Complete by: As directed    Advance diet slowly from full liquids to soft blendarized diet and then to regular diet May take probiotics Followup with Dr. Cristina Gong   Increase activity slowly   Complete by: As directed      Allergies as of 08/23/2019      Reactions   Augmentin [amoxicillin-pot Clavulanate] Nausea And Vomiting   Vomiting   Nsaids Other (See Comments)   Uncoded Allergy. Allergen: steriods,  Other Reaction: AGITATION & SLEEPLESNESS   Adhesive [tape]    Erythromycin    Latex Other (See Comments)   Redness and irritation   Levaquin [levofloxacin In D5w]    Rapid heart beat   Lisinopril    Neosporin [neomycin-bacitracin Zn-polymyx]    Nickel    Other    Steroid sensitive, pt feels agitated and can't sleep.      Medication List    TAKE these medications   diazepam 5 MG tablet Commonly known as: VALIUM Take 1 tablet by mouth daily as needed for anxiety.   propranolol 10 MG tablet Commonly known as: INDERAL Take 10 mg by mouth daily.      Follow-up Information    Velna Hatchet, MD Follow up.   Specialty: Internal Medicine Contact information: [place] [address] [phone]        Ronald Lobo, MD Follow up.   Specialty: Gastroenterology Contact information: [id number] [place] [address] [phone]           Signed: Pedro Earls 08/23/2019, 8:30 AM

## 2019-08-23 NOTE — Discharge Planning (Signed)
Patient discharged home in stable condition. Verbalizes understanding of all discharge instructions, including home medications and follow up appointments. 

## 2019-08-28 DIAGNOSIS — M797 Fibromyalgia: Secondary | ICD-10-CM | POA: Diagnosis not present

## 2019-08-28 DIAGNOSIS — K5720 Diverticulitis of large intestine with perforation and abscess without bleeding: Secondary | ICD-10-CM | POA: Diagnosis not present

## 2019-09-03 DIAGNOSIS — L400 Psoriasis vulgaris: Secondary | ICD-10-CM | POA: Diagnosis not present

## 2019-09-04 DIAGNOSIS — H02885 Meibomian gland dysfunction left lower eyelid: Secondary | ICD-10-CM | POA: Diagnosis not present

## 2019-09-10 DIAGNOSIS — K5700 Diverticulitis of small intestine with perforation and abscess without bleeding: Secondary | ICD-10-CM | POA: Diagnosis not present

## 2019-09-11 DIAGNOSIS — L870 Keratosis follicularis et parafollicularis in cutem penetrans: Secondary | ICD-10-CM | POA: Diagnosis not present

## 2019-09-17 DIAGNOSIS — L400 Psoriasis vulgaris: Secondary | ICD-10-CM | POA: Diagnosis not present

## 2019-09-18 DIAGNOSIS — R633 Feeding difficulties: Secondary | ICD-10-CM | POA: Diagnosis not present

## 2019-09-18 DIAGNOSIS — R1013 Epigastric pain: Secondary | ICD-10-CM | POA: Diagnosis not present

## 2019-09-19 DIAGNOSIS — Z23 Encounter for immunization: Secondary | ICD-10-CM | POA: Diagnosis not present

## 2019-09-24 DIAGNOSIS — L400 Psoriasis vulgaris: Secondary | ICD-10-CM | POA: Diagnosis not present

## 2019-10-01 DIAGNOSIS — R634 Abnormal weight loss: Secondary | ICD-10-CM | POA: Diagnosis not present

## 2019-10-01 DIAGNOSIS — I10 Essential (primary) hypertension: Secondary | ICD-10-CM | POA: Diagnosis not present

## 2019-10-01 DIAGNOSIS — K5720 Diverticulitis of large intestine with perforation and abscess without bleeding: Secondary | ICD-10-CM | POA: Diagnosis not present

## 2019-10-01 DIAGNOSIS — R194 Change in bowel habit: Secondary | ICD-10-CM | POA: Diagnosis not present

## 2019-10-01 DIAGNOSIS — E785 Hyperlipidemia, unspecified: Secondary | ICD-10-CM | POA: Diagnosis not present

## 2019-10-02 DIAGNOSIS — L400 Psoriasis vulgaris: Secondary | ICD-10-CM | POA: Diagnosis not present

## 2019-10-03 DIAGNOSIS — E041 Nontoxic single thyroid nodule: Secondary | ICD-10-CM | POA: Diagnosis not present

## 2019-10-03 DIAGNOSIS — E7849 Other hyperlipidemia: Secondary | ICD-10-CM | POA: Diagnosis not present

## 2019-10-09 DIAGNOSIS — L400 Psoriasis vulgaris: Secondary | ICD-10-CM | POA: Diagnosis not present

## 2019-10-16 DIAGNOSIS — L821 Other seborrheic keratosis: Secondary | ICD-10-CM | POA: Diagnosis not present

## 2019-10-16 DIAGNOSIS — D225 Melanocytic nevi of trunk: Secondary | ICD-10-CM | POA: Diagnosis not present

## 2019-10-16 DIAGNOSIS — D2261 Melanocytic nevi of right upper limb, including shoulder: Secondary | ICD-10-CM | POA: Diagnosis not present

## 2019-10-16 DIAGNOSIS — D2262 Melanocytic nevi of left upper limb, including shoulder: Secondary | ICD-10-CM | POA: Diagnosis not present

## 2019-10-16 DIAGNOSIS — L820 Inflamed seborrheic keratosis: Secondary | ICD-10-CM | POA: Diagnosis not present

## 2019-10-16 DIAGNOSIS — L400 Psoriasis vulgaris: Secondary | ICD-10-CM | POA: Diagnosis not present

## 2019-10-16 DIAGNOSIS — L718 Other rosacea: Secondary | ICD-10-CM | POA: Diagnosis not present

## 2019-10-17 DIAGNOSIS — Z1159 Encounter for screening for other viral diseases: Secondary | ICD-10-CM | POA: Diagnosis not present

## 2019-10-22 ENCOUNTER — Other Ambulatory Visit: Payer: Self-pay

## 2019-10-22 DIAGNOSIS — R1011 Right upper quadrant pain: Secondary | ICD-10-CM | POA: Diagnosis not present

## 2019-10-22 HISTORY — PX: ESOPHAGOGASTRODUODENOSCOPY ENDOSCOPY: SHX5814

## 2019-10-24 ENCOUNTER — Other Ambulatory Visit: Payer: Self-pay

## 2019-10-24 ENCOUNTER — Ambulatory Visit (INDEPENDENT_AMBULATORY_CARE_PROVIDER_SITE_OTHER): Payer: Medicare Other | Admitting: Obstetrics & Gynecology

## 2019-10-24 ENCOUNTER — Encounter

## 2019-10-24 ENCOUNTER — Encounter: Payer: Self-pay | Admitting: Obstetrics & Gynecology

## 2019-10-24 VITALS — BP 128/68 | HR 88 | Temp 97.2°F | Resp 12 | Ht 63.25 in | Wt 113.8 lb

## 2019-10-24 DIAGNOSIS — Z01419 Encounter for gynecological examination (general) (routine) without abnormal findings: Secondary | ICD-10-CM

## 2019-10-24 DIAGNOSIS — K319 Disease of stomach and duodenum, unspecified: Secondary | ICD-10-CM | POA: Diagnosis not present

## 2019-10-24 NOTE — Progress Notes (Signed)
68 y.o. G1P1 Married White or Caucasian female here for annual exam.  Just had brand new grand baby born--Daphne Ward Fabio Neighbors.  This has been such a joy for her.    Denies vaginal bleeding.  Was hospitalized in August due to perforated duodenal diverticulitis.  CT 08/17/2019 showed free air and duodenal perforation.  Was hospitalized for six days.  She was on bowel rest while in the hospital.  She has lost weight.  Had endoscopy for follow up done yesterday.  This was done down into the duodenum.  No site for peroration was noted.  She's eating small meals every 2 hours but stomach can hold only so much.  She has lost 14# since prior visit.    Denies.    Patient's last menstrual period was 08/25/2004.          Sexually active: Yes.    The current method of family planning is post menopausal status.    Exercising: Yes.    walking Smoker:  no  Health Maintenance: Pap:   07/05/18 Neg  04/10/16 Neg. HR HPV:neg              04/01/14 Neg  History of abnormal Pap:  no MMG:  12/10/18 BIRADS 1 negative/density d Colonoscopy:  05/03/18.  Follow up five years BMD:   03/18/18 Osteopenia  TDaP:  06/24/19  Pneumonia vaccine(s):  Prevnar 13 - 03/28/19  Shingrix:   never Hep C testing: 04/10/16 Neg Screening Labs: PCP   reports that she has quit smoking. She has never used smokeless tobacco. She reports that she does not drink alcohol or use drugs.  Past Medical History:  Diagnosis Date  . Atrophic vaginitis 03/20/2013  . ELEVATED BP READING WITHOUT DX HYPERTENSION 10/04/2010  . FIBROMYALGIA 10/04/2010  . IBS 10/26/2010  . Osteopenia   . Osteoporosis    in the spine  . Other anxiety states 10/26/2010  . PALPITATIONS, RECURRENT 10/26/2010  . Perforated diverticulum 07/2019  . RHINITIS 11/23/2010  . SINUSITIS - ACUTE-NOS 11/28/2010  . SLEEP DISORDER/DISTURBANCE 10/04/2010  . THYROID NODULE 10/04/2010    Past Surgical History:  Procedure Laterality Date  . BREAST SURGERY  12/25/1990 and 12/25/1992   2  Done on Rt breast  . CESAREAN SECTION  12/26/1987  . DILATION AND CURETTAGE OF UTERUS  2004  . ESOPHAGOGASTRODUODENOSCOPY ENDOSCOPY  10/22/2019  . TEMPOROMANDIBULAR JOINT SURGERY  12/25/1993    Current Outpatient Medications  Medication Sig Dispense Refill  . Cobalamin Combinations (0000000 + FOLIC ACID PO) Take by mouth.    . diazepam (VALIUM) 5 MG tablet Take 1 tablet by mouth daily as needed for anxiety.   0  . hydroxypropyl methylcellulose / hypromellose (ISOPTO TEARS / GONIOVISC) 2.5 % ophthalmic solution 1 drop.    . magnesium 30 MG tablet Take 30 mg by mouth 2 (two) times daily.    . propranolol (INDERAL) 10 MG tablet Take 10 mg by mouth daily.     . Vitamin D-Vitamin K (VITAMIN K2-VITAMIN D3 PO) Take by mouth.     No current facility-administered medications for this visit.     Family History  Problem Relation Age of Onset  . Heart disease Father   . Stroke Father   . Hypertension Mother        Mild  . Hypothyroidism Mother   . Migraines Sister   . Hypertension Sister   . Hypothyroidism Brother   . Cancer Brother        Cancer of neck  .  Hypothyroidism Sister   . Hypertension Brother   . Diabetes Brother   . Heart disease Brother        Open Heart Disease  . Hyperlipidemia Brother   . Hypertension Brother   . Cancer Maternal Grandfather        pancreatic  . Osteoporosis Sister        hypercalciuria    Review of Systems  Constitutional: Negative.   HENT: Negative.   Eyes: Negative.   Respiratory: Negative.   Cardiovascular: Negative.   Gastrointestinal: Negative.   Endocrine: Negative.   Genitourinary: Negative.   Musculoskeletal: Negative.   Skin: Negative.   Allergic/Immunologic: Negative.   Neurological: Negative.   Hematological: Negative.   Psychiatric/Behavioral: Negative.     Exam:   BP 128/68 (BP Location: Left Arm, Patient Position: Sitting, Cuff Size: Normal)   Pulse 88   Temp (!) 97.2 F (36.2 C) (Temporal)   Resp 12   Ht 5' 3.25"  (1.607 m)   Wt 113 lb 12.8 oz (51.6 kg)   LMP 08/25/2004   BMI 20.00 kg/m   Weight:  -14#   Height: 5' 3.25" (160.7 cm)  Ht Readings from Last 3 Encounters:  10/24/19 5' 3.25" (1.607 m)  08/17/19 5\' 4"  (1.626 m)  07/05/18 5' 3.5" (1.613 m)    General appearance: alert, cooperative and appears stated age Head: Normocephalic, without obvious abnormality, atraumatic Neck: no adenopathy, supple, symmetrical, trachea midline and thyroid normal to inspection and palpation Lungs: clear to auscultation bilaterally Breasts: normal appearance, no masses or tenderness Heart: regular rate and rhythm Abdomen: soft, non-tender; bowel sounds normal; no masses,  no organomegaly Extremities: extremities normal, atraumatic, no cyanosis or edema Skin: Skin color, texture, turgor normal. No rashes or lesions Lymph nodes: Cervical, supraclavicular, and axillary nodes normal. No abnormal inguinal nodes palpated Neurologic: Grossly normal   Pelvic: External genitalia:  no lesions              Urethra:  normal appearing urethra with no masses, tenderness or lesions              Bartholins and Skenes: normal                 Vagina: normal appearing vagina with normal color and discharge, no lesions              Cervix: no lesions              Pap taken: No. Bimanual Exam:  Uterus:  normal size, contour, position, consistency, mobility, non-tender              Adnexa: normal adnexa and no mass, fullness, tenderness               Rectovaginal: Confirms               Anus:  normal sphincter tone, no lesions  Chaperone was present for exam.  A:  Well Woman with normal exam PMP, no HRT Osteopenia Fibromyalgia  H/o thyroid nodules, was followed by DR. Altheimer Recent duodenal ulceration due to diverticulitis (or this was the working diagnosis)  P:   Mammogram guidelines reviewed  pap smear neg 2019.   Not indicated today. Lab work done with Dr. Ardeth Perfect Referral to GI at [facility] for second  opinion Vaccines discussed.  Declines shingrix this year. Return annually or prn

## 2019-10-27 DIAGNOSIS — L400 Psoriasis vulgaris: Secondary | ICD-10-CM | POA: Diagnosis not present

## 2019-10-31 DIAGNOSIS — H6123 Impacted cerumen, bilateral: Secondary | ICD-10-CM | POA: Diagnosis not present

## 2019-11-03 ENCOUNTER — Other Ambulatory Visit: Payer: Self-pay

## 2019-11-03 DIAGNOSIS — Z20822 Contact with and (suspected) exposure to covid-19: Secondary | ICD-10-CM

## 2019-11-03 DIAGNOSIS — L400 Psoriasis vulgaris: Secondary | ICD-10-CM | POA: Diagnosis not present

## 2019-11-04 LAB — NOVEL CORONAVIRUS, NAA: SARS-CoV-2, NAA: NOT DETECTED

## 2019-11-10 DIAGNOSIS — L400 Psoriasis vulgaris: Secondary | ICD-10-CM | POA: Diagnosis not present

## 2019-11-13 ENCOUNTER — Other Ambulatory Visit: Payer: Self-pay | Admitting: Gastroenterology

## 2019-11-13 DIAGNOSIS — R634 Abnormal weight loss: Secondary | ICD-10-CM

## 2019-11-14 DIAGNOSIS — R634 Abnormal weight loss: Secondary | ICD-10-CM | POA: Diagnosis not present

## 2019-11-14 DIAGNOSIS — R748 Abnormal levels of other serum enzymes: Secondary | ICD-10-CM | POA: Diagnosis not present

## 2019-11-17 DIAGNOSIS — L400 Psoriasis vulgaris: Secondary | ICD-10-CM | POA: Diagnosis not present

## 2019-11-25 ENCOUNTER — Ambulatory Visit
Admission: RE | Admit: 2019-11-25 | Discharge: 2019-11-25 | Disposition: A | Discharge status: Home / Self Care | Payer: Medicare Other | Source: Ambulatory Visit | Attending: Gastroenterology | Admitting: Gastroenterology

## 2019-11-25 DIAGNOSIS — K3189 Other diseases of stomach and duodenum: Secondary | ICD-10-CM | POA: Diagnosis not present

## 2019-11-25 DIAGNOSIS — R634 Abnormal weight loss: Secondary | ICD-10-CM

## 2019-11-25 MED ORDER — IOPAMIDOL (ISOVUE-300) INJECTION 61%
100.0000 mL | Freq: Once | INTRAVENOUS | Status: AC | PRN
  Administered 2019-11-25: 100 mL via INTRAVENOUS

## 2019-11-26 DIAGNOSIS — K5712 Diverticulitis of small intestine without perforation or abscess without bleeding: Secondary | ICD-10-CM | POA: Diagnosis not present

## 2019-11-26 DIAGNOSIS — R634 Abnormal weight loss: Secondary | ICD-10-CM | POA: Diagnosis not present

## 2019-11-27 DIAGNOSIS — L400 Psoriasis vulgaris: Secondary | ICD-10-CM | POA: Diagnosis not present

## 2019-12-04 DIAGNOSIS — L400 Psoriasis vulgaris: Secondary | ICD-10-CM | POA: Diagnosis not present

## 2019-12-10 DIAGNOSIS — L400 Psoriasis vulgaris: Secondary | ICD-10-CM | POA: Diagnosis not present

## 2020-01-06 ENCOUNTER — Other Ambulatory Visit: Payer: Medicare Other

## 2020-01-09 ENCOUNTER — Other Ambulatory Visit: Payer: Medicare Other

## 2020-01-09 ENCOUNTER — Other Ambulatory Visit: Payer: Self-pay

## 2020-01-09 ENCOUNTER — Emergency Department (HOSPITAL_BASED_OUTPATIENT_CLINIC_OR_DEPARTMENT_OTHER)
Admission: EM | Admit: 2020-01-09 | Discharge: 2020-01-09 | Disposition: A | Discharge status: Home / Self Care | Payer: Medicare Other | Attending: Emergency Medicine | Admitting: Emergency Medicine

## 2020-01-09 ENCOUNTER — Encounter (HOSPITAL_BASED_OUTPATIENT_CLINIC_OR_DEPARTMENT_OTHER): Payer: Self-pay

## 2020-01-09 ENCOUNTER — Emergency Department (HOSPITAL_BASED_OUTPATIENT_CLINIC_OR_DEPARTMENT_OTHER): Payer: Medicare Other

## 2020-01-09 DIAGNOSIS — R197 Diarrhea, unspecified: Secondary | ICD-10-CM | POA: Diagnosis present

## 2020-01-09 DIAGNOSIS — Z880 Allergy status to penicillin: Secondary | ICD-10-CM | POA: Diagnosis not present

## 2020-01-09 DIAGNOSIS — A0472 Enterocolitis due to Clostridium difficile, not specified as recurrent: Secondary | ICD-10-CM | POA: Insufficient documentation

## 2020-01-09 DIAGNOSIS — Z79899 Other long term (current) drug therapy: Secondary | ICD-10-CM | POA: Diagnosis not present

## 2020-01-09 DIAGNOSIS — R1030 Lower abdominal pain, unspecified: Secondary | ICD-10-CM | POA: Diagnosis not present

## 2020-01-09 DIAGNOSIS — Z883 Allergy status to other anti-infective agents status: Secondary | ICD-10-CM | POA: Insufficient documentation

## 2020-01-09 DIAGNOSIS — Z87891 Personal history of nicotine dependence: Secondary | ICD-10-CM | POA: Diagnosis not present

## 2020-01-09 DIAGNOSIS — Z888 Allergy status to other drugs, medicaments and biological substances status: Secondary | ICD-10-CM | POA: Diagnosis not present

## 2020-01-09 DIAGNOSIS — Z91040 Latex allergy status: Secondary | ICD-10-CM | POA: Insufficient documentation

## 2020-01-09 DIAGNOSIS — Z91048 Other nonmedicinal substance allergy status: Secondary | ICD-10-CM | POA: Insufficient documentation

## 2020-01-09 DIAGNOSIS — M797 Fibromyalgia: Secondary | ICD-10-CM | POA: Diagnosis not present

## 2020-01-09 DIAGNOSIS — I10 Essential (primary) hypertension: Secondary | ICD-10-CM | POA: Insufficient documentation

## 2020-01-09 DIAGNOSIS — Z881 Allergy status to other antibiotic agents status: Secondary | ICD-10-CM | POA: Diagnosis not present

## 2020-01-09 DIAGNOSIS — R1110 Vomiting, unspecified: Secondary | ICD-10-CM | POA: Diagnosis not present

## 2020-01-09 LAB — URINALYSIS, MICROSCOPIC (REFLEX)

## 2020-01-09 LAB — CBC WITH DIFFERENTIAL/PLATELET
Abs Immature Granulocytes: 0.03 10*3/uL (ref 0.00–0.07)
Basophils Absolute: 0 10*3/uL (ref 0.0–0.1)
Basophils Relative: 0 %
Eosinophils Absolute: 0.1 10*3/uL (ref 0.0–0.5)
Eosinophils Relative: 1 %
HCT: 44.3 % (ref 36.0–46.0)
Hemoglobin: 14.1 g/dL (ref 12.0–15.0)
Immature Granulocytes: 0 %
Lymphocytes Relative: 13 %
Lymphs Abs: 1.3 10*3/uL (ref 0.7–4.0)
MCH: 28 pg (ref 26.0–34.0)
MCHC: 31.8 g/dL (ref 30.0–36.0)
MCV: 88.1 fL (ref 80.0–100.0)
Monocytes Absolute: 0.9 10*3/uL (ref 0.1–1.0)
Monocytes Relative: 9 %
Neutro Abs: 7.9 10*3/uL — ABNORMAL HIGH (ref 1.7–7.7)
Neutrophils Relative %: 77 %
Platelets: 262 10*3/uL (ref 150–400)
RBC: 5.03 MIL/uL (ref 3.87–5.11)
RDW: 13.1 % (ref 11.5–15.5)
WBC: 10.2 10*3/uL (ref 4.0–10.5)
nRBC: 0 % (ref 0.0–0.2)

## 2020-01-09 LAB — URINALYSIS, ROUTINE W REFLEX MICROSCOPIC
Bilirubin Urine: NEGATIVE
Glucose, UA: NEGATIVE mg/dL
Ketones, ur: 15 mg/dL — AB
Leukocytes,Ua: NEGATIVE
Nitrite: NEGATIVE
Protein, ur: NEGATIVE mg/dL
Specific Gravity, Urine: 1.02 (ref 1.005–1.030)
pH: 5.5 (ref 5.0–8.0)

## 2020-01-09 LAB — COMPREHENSIVE METABOLIC PANEL
ALT: 20 U/L (ref 0–44)
AST: 27 U/L (ref 15–41)
Albumin: 4 g/dL (ref 3.5–5.0)
Alkaline Phosphatase: 95 U/L (ref 38–126)
Anion gap: 10 (ref 5–15)
BUN: 9 mg/dL (ref 8–23)
CO2: 30 mmol/L (ref 22–32)
Calcium: 9.2 mg/dL (ref 8.9–10.3)
Chloride: 101 mmol/L (ref 98–111)
Creatinine, Ser: 0.78 mg/dL (ref 0.44–1.00)
GFR calc Af Amer: >60 mL/min (ref >60)
GFR calc non Af Amer: >60 mL/min (ref >60)
Glucose, Bld: 106 mg/dL — ABNORMAL HIGH (ref 70–99)
Potassium: 3.5 mmol/L (ref 3.5–5.1)
Sodium: 141 mmol/L (ref 135–145)
Total Bilirubin: 1 mg/dL (ref 0.3–1.2)
Total Protein: 7.1 g/dL (ref 6.5–8.1)

## 2020-01-09 LAB — LACTIC ACID, PLASMA: Lactic Acid, Venous: 0.9 mmol/L (ref 0.5–1.9)

## 2020-01-09 LAB — LIPASE, BLOOD: Lipase: 33 U/L (ref 11–51)

## 2020-01-09 MED ORDER — SODIUM CHLORIDE 0.9 % IV BOLUS
1000.0000 mL | Freq: Once | INTRAVENOUS | Status: AC
  Administered 2020-01-09: 1000 mL via INTRAVENOUS

## 2020-01-09 MED ORDER — IOHEXOL 300 MG/ML  SOLN
100.0000 mL | Freq: Once | INTRAMUSCULAR | Status: AC | PRN
  Administered 2020-01-09: 85 mL via INTRAVENOUS

## 2020-01-09 MED ORDER — ONDANSETRON HCL 4 MG/2ML IJ SOLN
4.0000 mg | Freq: Once | INTRAMUSCULAR | Status: AC
  Administered 2020-01-09: 4 mg via INTRAVENOUS
  Filled 2020-01-09: qty 2

## 2020-01-09 MED ORDER — MORPHINE SULFATE (PF) 4 MG/ML IV SOLN
4.0000 mg | Freq: Once | INTRAVENOUS | Status: AC
  Administered 2020-01-09: 4 mg via INTRAVENOUS
  Filled 2020-01-09: qty 1

## 2020-01-09 NOTE — ED Notes (Signed)
ED Provider at bedside. 

## 2020-01-09 NOTE — Discharge Instructions (Addendum)
Continue your antibiotic. Make sure to keep well hydrated at home.   Call your gastroenterologist in the next 2-3 days for recheck of your symptoms.  Please return to the ER sooner if you have any new or worsening symptoms, or if you have any of the following symptoms:  Abdominal pain that does not go away.  You have a fever.  You keep throwing up (vomiting).  The pain is felt only in portions of the abdomen. Pain in the right side could possibly be appendicitis. In an adult, pain in the left lower portion of the abdomen could be colitis or diverticulitis.  You pass bloody or black tarry stools.  There is bright red blood in the stool.  The constipation stays for more than 4 days.  There is belly (abdominal) or rectal pain.  You do not seem to be getting better.  You have any questions or concerns.

## 2020-01-09 NOTE — ED Provider Notes (Signed)
[facility] EMERGENCY DEPARTMENT Provider Note   CSN: [id number] Arrival date & time: 01/09/20  1242     History Chief Complaint  Patient presents with  . Diarrhea    Denise Young is a 69 y.o. female.  HPI   69 year old female with a history of fibromyalgia, IBS, osteoporosis, palpitations, diverticulitis, who presents to the emergency department today for evaluation of diarrhea and concern for dehydration.  Patient states she was recently treated with antibiotics for a sinus infection.  She developed diarrhea 10 days ago and was evaluated by her GI doctor, Dr. Cristina Gong who tested her for C. difficile.  Test came back positive and she was started on vancomycin 2 days ago.  She has been tolerating vancomycin well however she has had up to 18 stools per day and feels like she is getting dehydrated.  She reports some lower abdominal pain that is moderate in nature.  She denies any vomiting, fevers, bloody stools.  She has been taking a probiotic.  She has seen no improvement in the number of stools she is having per day.  Past Medical History:  Diagnosis Date  . Atrophic vaginitis 03/20/2013  . ELEVATED BP READING WITHOUT DX HYPERTENSION 10/04/2010  . FIBROMYALGIA 10/04/2010  . IBS 10/26/2010  . Osteopenia   . Osteoporosis    in the spine  . Other anxiety states 10/26/2010  . PALPITATIONS, RECURRENT 10/26/2010  . Perforated diverticulum 07/2019  . RHINITIS 11/23/2010  . SINUSITIS - ACUTE-NOS 11/28/2010  . SLEEP DISORDER/DISTURBANCE 10/04/2010  . THYROID NODULE 10/04/2010    Patient Active Problem List   Diagnosis Date Noted  . Perforated diverticulum of duodenum 08/17/2019  . Essential hypertension 11/28/2016  . Psoriasis 09/21/2016  . Osteoporosis 04/01/2014  . Atrophic vaginitis 03/20/2013  . IBS 10/26/2010  . PALPITATIONS, RECURRENT 10/26/2010  . Thyroid nodule 10/04/2010  . FIBROMYALGIA 10/04/2010  . Disturbance in sleep behavior 10/04/2010  . ELEVATED BP  READING WITHOUT DX HYPERTENSION 10/04/2010    Past Surgical History:  Procedure Laterality Date  . BREAST SURGERY  12/25/1990 and 12/25/1992   2 Done on Rt breast  . CESAREAN SECTION  12/26/1987  . DILATION AND CURETTAGE OF UTERUS  2004  . ESOPHAGOGASTRODUODENOSCOPY ENDOSCOPY  10/22/2019  . TEMPOROMANDIBULAR JOINT SURGERY  12/25/1993     OB History    Gravida  1   Para  1   Term      Preterm      AB      Living  1     SAB      TAB      Ectopic      Multiple      Live Births              Family History  Problem Relation Age of Onset  . Heart disease Father   . Stroke Father   . Hypertension Mother        Mild  . Hypothyroidism Mother   . Migraines Sister   . Hypertension Sister   . Hypothyroidism Brother   . Cancer Brother        Cancer of neck  . Hypothyroidism Sister   . Hypertension Brother   . Diabetes Brother   . Heart disease Brother        Open Heart Disease  . Hyperlipidemia Brother   . Hypertension Brother   . Cancer Maternal Grandfather        pancreatic  . Osteoporosis Sister  hypercalciuria    Social History   Tobacco Use  . Smoking status: Former Research scientist (life sciences)  . Smokeless tobacco: Never Used  . Tobacco comment: quit in 1982  Substance Use Topics  . Alcohol use: No  . Drug use: No    Home Medications Prior to Admission medications   Medication Sig Start Date End Date Taking? Authorizing Provider  vancomycin (VANCOCIN) 125 MG capsule Take 125 mg by mouth 4 (four) times daily.   Yes [provider]  Cobalamin Combinations (0000000 + FOLIC ACID PO) Take by mouth.    [provider]  diazepam (VALIUM) 5 MG tablet Take 1 tablet by mouth daily as needed for anxiety.  05/28/18   [provider]  hydroxypropyl methylcellulose / hypromellose (ISOPTO TEARS / GONIOVISC) 2.5 % ophthalmic solution 1 drop.    [provider]  magnesium 30 MG tablet Take 30 mg by mouth 2 (two) times daily.    [provider]  propranolol (INDERAL) 10 MG tablet Take 10 mg by mouth daily.     [provider]  Vitamin D-Vitamin K (VITAMIN K2-VITAMIN D3 PO) Take by mouth.    [provider]    Allergies    Augmentin [amoxicillin-pot clavulanate], Nsaids, Adhesive [tape], Erythromycin, Flagyl [metronidazole], Latex, Levaquin [levofloxacin in d5w], Lisinopril, Neosporin [neomycin-bacitracin zn-polymyx], Nickel, and Other  Review of Systems   Review of Systems  Constitutional: Negative for chills and fever.  HENT: Negative for ear pain and sore throat.   Eyes: Negative for pain and visual disturbance.  Respiratory: Negative for cough and shortness of breath.   Cardiovascular: Positive for palpitations. Negative for chest pain.  Gastrointestinal: Positive for abdominal pain and diarrhea. Negative for blood in stool, nausea and vomiting.  Genitourinary: Negative for dysuria and hematuria.  Musculoskeletal: Negative for back pain.  Skin: Negative for color change and rash.  Neurological: Negative for seizures and syncope.  All other systems reviewed and are negative.   Physical Exam Updated Vital Signs BP 129/65   Pulse 98   Temp 98.5 F (36.9 C) (Oral)   Resp 20   Ht 5\' 3"  (1.6 m)   Wt 51.1 kg   LMP 08/25/2004   SpO2 99%   BMI 19.96 kg/m   Physical Exam Vitals and nursing note reviewed.  Constitutional:      General: She is not in acute distress.    Appearance: She is well-developed. She is not ill-appearing or toxic-appearing.  HENT:     Head: Normocephalic and atraumatic.     Mouth/Throat:     Mouth: Mucous membranes are dry.  Eyes:     Conjunctiva/sclera: Conjunctivae normal.  Cardiovascular:     Rate and Rhythm: Regular rhythm. Tachycardia present.     Heart sounds: Normal heart sounds. No murmur.  Pulmonary:     Effort: Pulmonary effort is normal. No respiratory distress.     Breath sounds: Normal breath sounds. No wheezing, rhonchi or rales.  Abdominal:       General: Bowel sounds are normal.     Palpations: Abdomen is soft.     Tenderness: There is abdominal tenderness. There is no guarding or rebound.  Musculoskeletal:     Cervical back: Neck supple.  Skin:    General: Skin is warm and dry.  Neurological:     Mental Status: She is alert.     ED Results / Procedures / Treatments   Labs (all labs ordered are listed, but only abnormal results are displayed) Labs Reviewed  CBC WITH DIFFERENTIAL/PLATELET - Abnormal; Notable for the following components:      Result Value   Neutro Abs 7.9 (*)    All other components within normal limits  COMPREHENSIVE METABOLIC PANEL - Abnormal; Notable for the following components:   Glucose, Bld 106 (*)    All other components within normal limits  URINALYSIS, ROUTINE W REFLEX MICROSCOPIC - Abnormal; Notable for the following components:   Hgb urine dipstick MODERATE (*)    Ketones, ur 15 (*)    All other components within normal limits  URINALYSIS, MICROSCOPIC (REFLEX) - Abnormal; Notable for the following components:   Bacteria, UA FEW (*)    All other components within normal limits  LIPASE, BLOOD  LACTIC ACID, PLASMA  LACTIC ACID, PLASMA    EKG None  Radiology CT ABDOMEN PELVIS W CONTRAST  Result Date: 01/09/2020 CLINICAL DATA:  Lower abdominal pain. Recent history of duodenal perforation in August. Diarrhea. Nausea. Vomiting. C difficile positive. Weakness. EXAM: CT ABDOMEN AND PELVIS WITH CONTRAST TECHNIQUE: Multidetector CT imaging of the abdomen and pelvis was performed using the standard protocol following bolus administration of intravenous contrast. CONTRAST:  86mL OMNIPAQUE IOHEXOL 300 MG/ML  SOLN COMPARISON:  11/25/2019 FINDINGS: Lower chest: Clear lung bases. Normal heart size without pericardial or pleural effusion. Hepatobiliary: Normal liver. Normal gallbladder, without biliary ductal dilatation. Pancreas: Normal, without mass or ductal dilatation. Spleen: Normal in size,  without focal abnormality. Adrenals/Urinary Tract: Normal adrenal glands. Left renal too small to characterize lesions. Normal right kidney, without hydronephrosis. Normal urinary bladder. Stomach/Bowel: Portions of the proximal stomach are underdistended. Apparent wall thickening is felt to be secondary. Small periampullary duodenal diverticulum again identified. No surrounding inflammation. Otherwise normal small bowel. Moderate wall thickening and mucosal hyperenhancement involve the sigmoid and upper rectum, including on 65/2. Large colonic stool burden more proximally. The cecum extends into the upper pelvis, with normal terminal ileum. Appendix not visualized. No free intraperitoneal air. Vascular/Lymphatic: Aortic and branch vessel atherosclerosis. No abdominopelvic adenopathy. Reproductive: Normal uterus and adnexa. Other: No significant free fluid. Musculoskeletal: No acute osseous abnormality. IMPRESSION: 1. Distal colonic wall thickening and mucosal hyperenhancement, consistent with the history of infectious colitis. 2.  Possible constipation. 3.  Aortic Atherosclerosis (ICD10-I70.0). Electronically Signed   By: Abigail Miyamoto M.D.   On: 01/09/2020 15:42    Procedures Procedures (including critical care time)  Medications Ordered in ED Medications  sodium chloride 0.9 % bolus 1,000 mL (0 mLs Intravenous Stopped 01/09/20 1530)  ondansetron (ZOFRAN) injection 4 mg (4 mg Intravenous Given 01/09/20 1336)  morphine 4 MG/ML injection 4 mg (4 mg Intravenous Given 01/09/20 1336)  sodium chloride 0.9 % bolus 1,000 mL (1,000 mLs Intravenous New Bag/Given 01/09/20 1529)  iohexol (OMNIPAQUE) 300 MG/ML solution 100 mL (85 mLs Intravenous Contrast Given 01/09/20 1513)    ED Course  I have reviewed the triage vital signs and the nursing notes.  Pertinent labs & imaging results that were available during my care of the patient were reviewed by me and considered in my medical decision making (see chart for  details).    MDM Rules/Calculators/A&P                      69 year old female with history of diarrhea x10 days, diagnosed with C. difficile earlier this week.  Has had 2 days of oral vancomycin. Followed up with GI today who was concerned for possible dehydration and advised to go to the ED.  VS with tachycardia, otherwise  reassuring. Pt nontoxic, nonseptic appearing.   CBC is without leukocytosis or anemia CMP with no gross electrolyte derangement, normal kidney and liver function. Lipase normal Lactic negative UA with moderate hematuria, 15 ketones, no UTI  CT abd/pelvis with distal colonic wall thickening and mucosal hyperenhancement, consistent with the history of infectious colitis. No emergent abnormality.   Pt given IVF, pain meds, antiemetics. On reassessment, she states her pain has improved. She has been able to tolerate PO. Her HR has improved. Discussed lab findings and CT results. She appears to be stable for discharge home with continued outpatient management. Advised to continue vancomycin and call her GI doc in 2-3 days for symptom check. She declined rx for pain meds or antiemetics. Advised on return precautions. She voices understanding of the plan and is in agreement. All questions answered, pt stable for d/c.   Case was discussed with Dr. Johnney Killian who is in agreement with plan.   Final Clinical Impression(s) / ED Diagnoses Final diagnoses:  Clostridium difficile diarrhea    Rx / DC Orders ED Discharge Orders    None       Bishop Dublin 01/09/20 1634    Charlesetta Shanks, MD 01/13/20 [phone]

## 2020-01-09 NOTE — ED Triage Notes (Signed)
Pt arrives POV to ED with reports of positive C Diff. Pt reports she has had diarrhea for 10 days following recent abx treatment for Sinus Infection. Pt states that Dr. Cristina Gong did start her on Vancomycin. Pt sent to ED for concerns of dehydration.

## 2020-01-13 DIAGNOSIS — A0472 Enterocolitis due to Clostridium difficile, not specified as recurrent: Secondary | ICD-10-CM | POA: Diagnosis not present

## 2020-01-22 DIAGNOSIS — L400 Psoriasis vulgaris: Secondary | ICD-10-CM | POA: Diagnosis not present

## 2020-01-23 DIAGNOSIS — A0472 Enterocolitis due to Clostridium difficile, not specified as recurrent: Secondary | ICD-10-CM | POA: Diagnosis not present

## 2020-01-27 DIAGNOSIS — H6123 Impacted cerumen, bilateral: Secondary | ICD-10-CM | POA: Diagnosis not present

## 2020-01-29 DIAGNOSIS — L400 Psoriasis vulgaris: Secondary | ICD-10-CM | POA: Diagnosis not present

## 2020-02-01 IMAGING — CT CT HEART SCORING
3 series · 14 of 20 positions shown, 16 images · non-contrast
Comparison: None.

CLINICAL DATA: Elevated cholesterol.  68-year-old female

EXAM:
CT HEART FOR CALCIUM SCORING
TECHNIQUE: CT heart was performed on a 256 channel system using prospective ECG
gating. A scout and noncontrast exam (for calcium scoring) were
performed. Note that this exam targets the heart and the chest was
not imaged in its entirety.

[Series 2: calcium scoring 2.00 qr36 bestdiast 70% · axial · 0.34mm/px · z∈[+1715,+1811]mm · 4 of 80 slices shown]
[im 16/80  vessel]
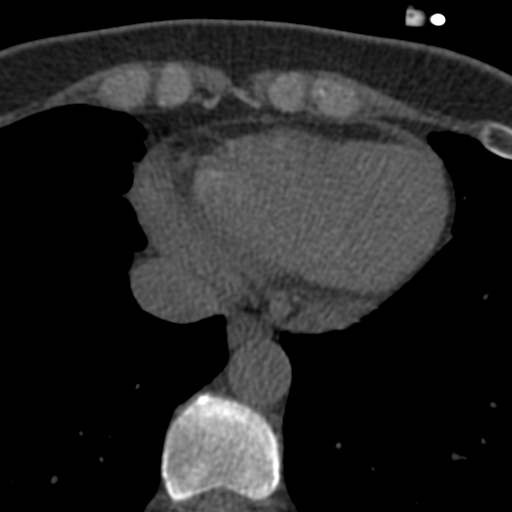
[im 32/80  vessel]
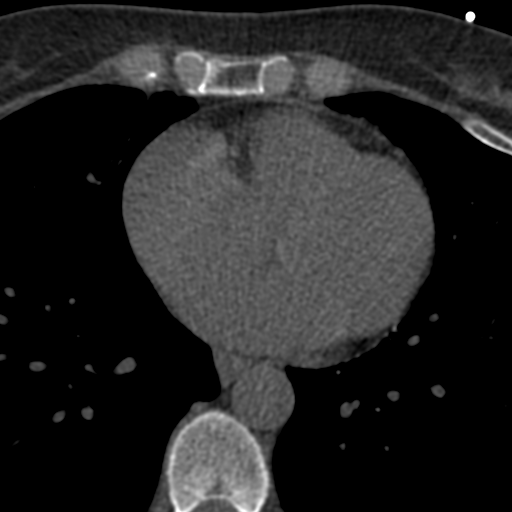
[im 48/80  vessel]
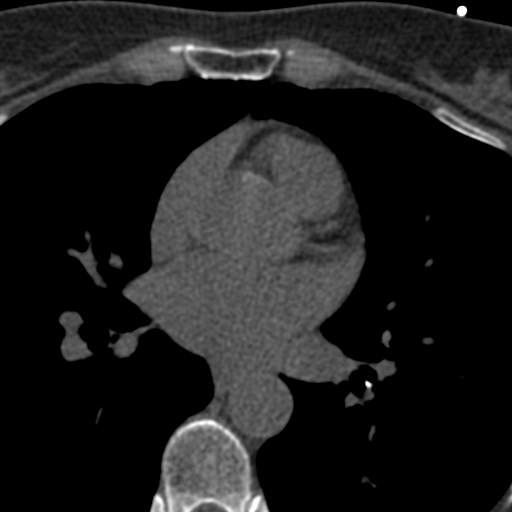
[im 64/80  vessel]
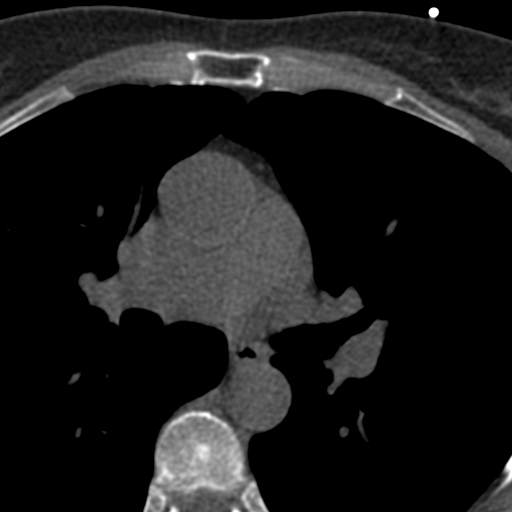

[Series 3: calcium scoring 2.00 br40 bestdiast 70% ax fov · axial · 0.49mm/px · z∈[+1711,+1815]mm · 5 of 80 slices shown, 7 images]
[im 14/80  vessel]
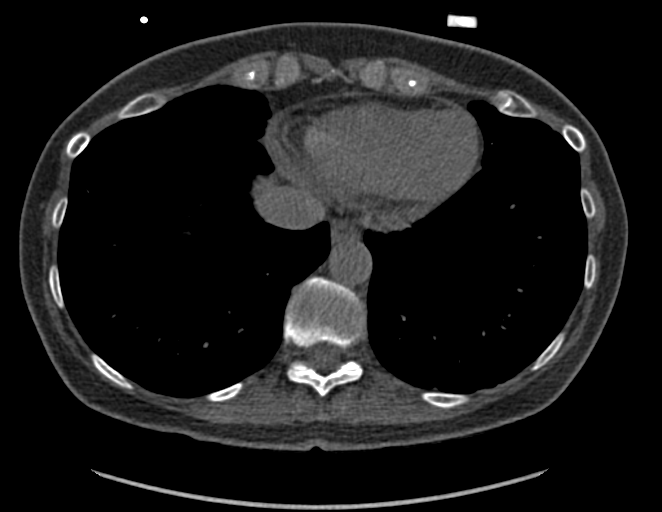
[im 14/80  lung]
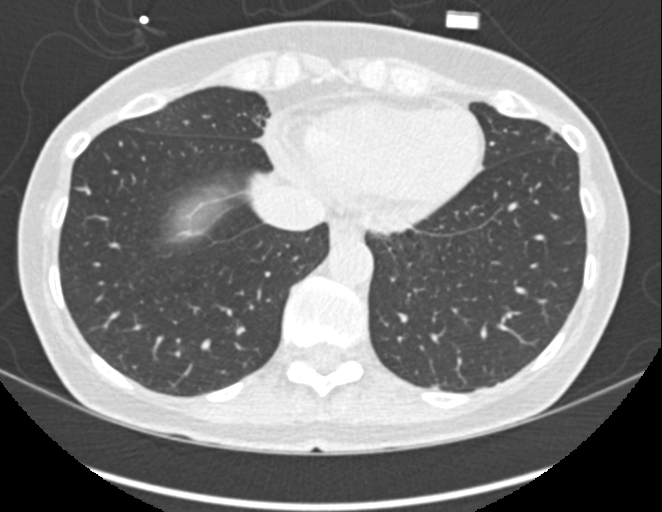
[im 27/80  vessel]
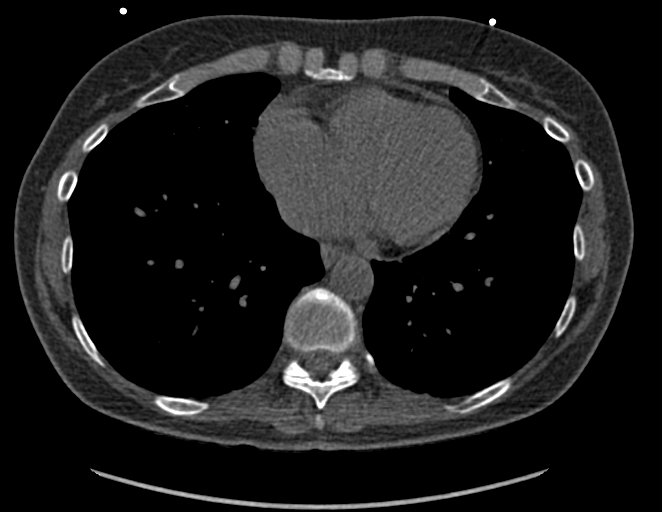
[im 40/80  vessel]
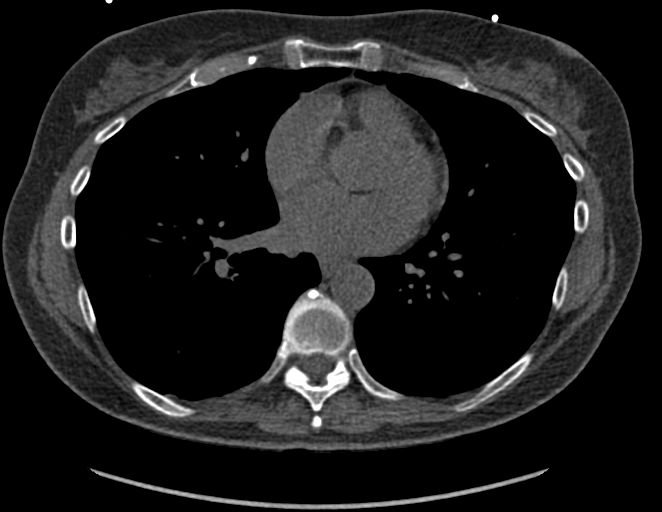
[im 53/80  vessel]
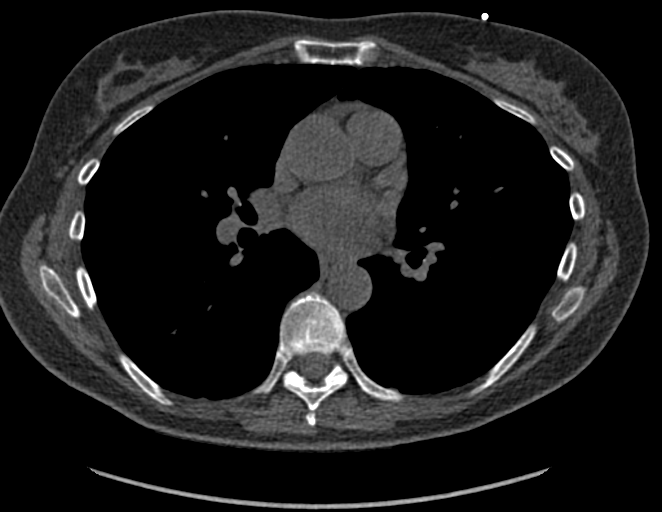
[im 66/80  vessel]
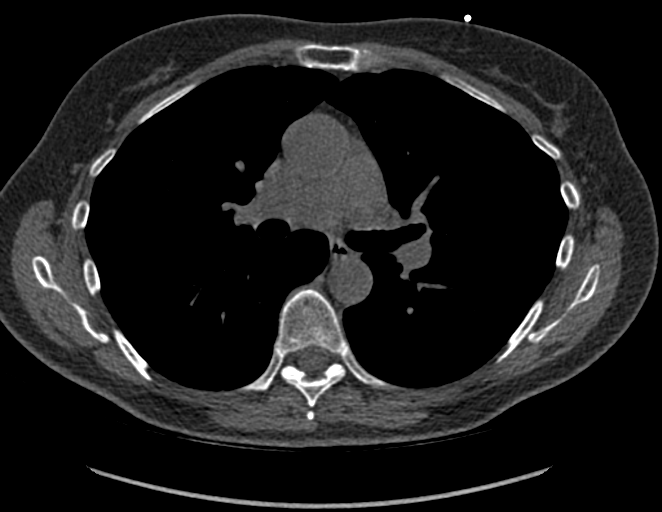
[im 66/80  lung]
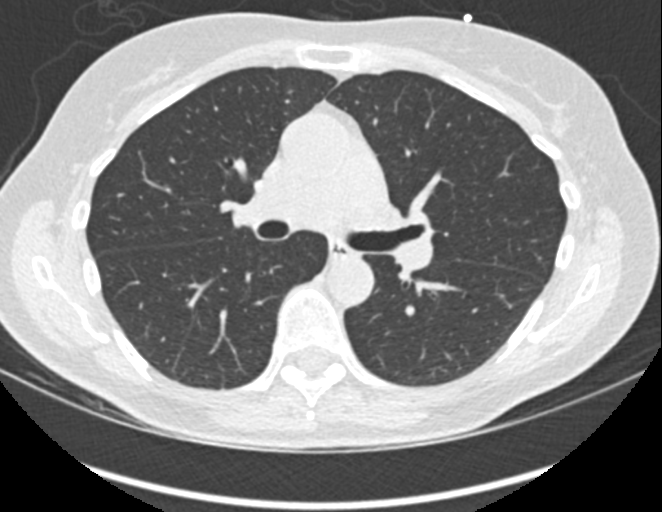

[Series 9: calcium scoring 2.00 br60 bestdiast 70% ax fov · axial · 0.49mm/px · z∈[+1711,+1815]mm · 5 of 80 slices shown]
[im 14/80  vessel]
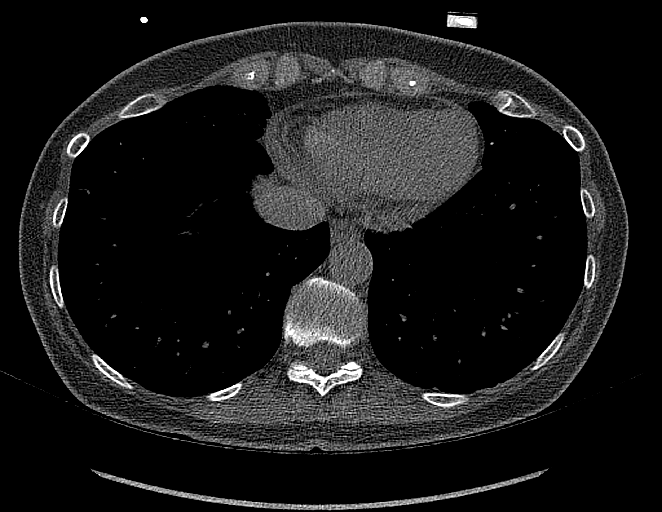
[im 27/80  vessel]
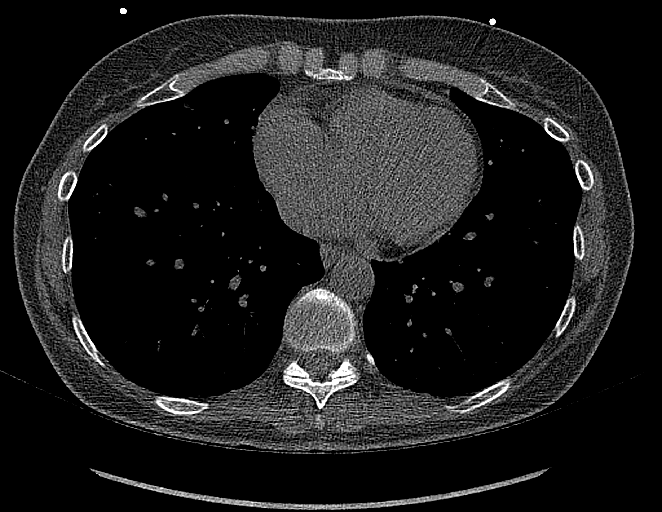
[im 40/80  vessel]
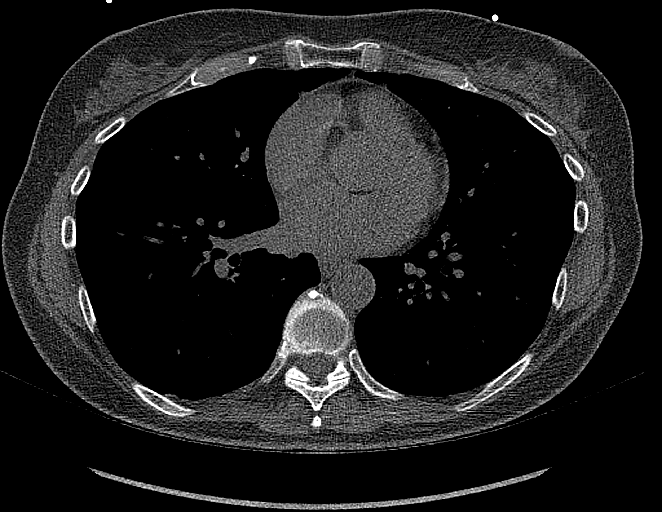
[im 53/80  vessel]
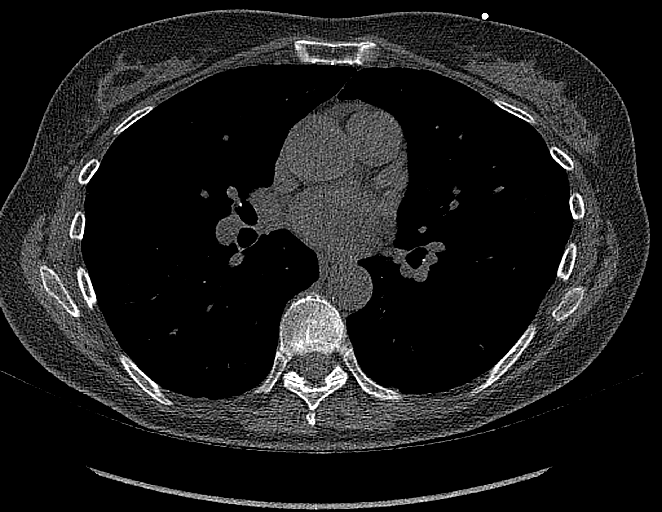
[im 66/80  vessel]
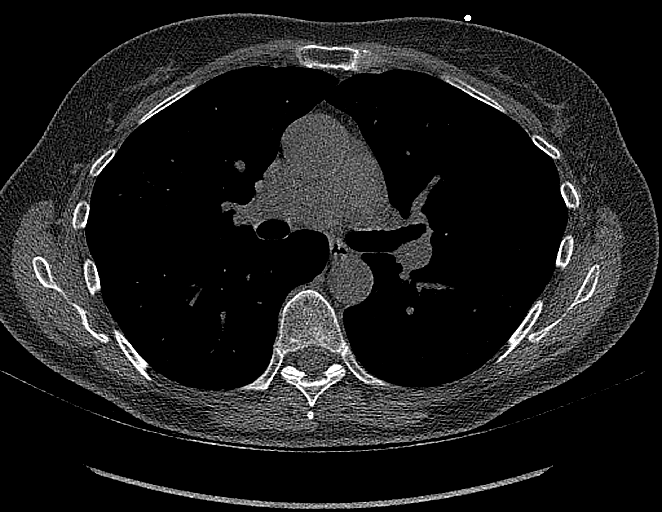

[14 of 20 positions shown; findings below may reference images not displayed]

FINDINGS: Technical quality: Good

No coronary artery calcification.

CORONARY CALCIUM

Total Agatston Score: 0

[HOSPITAL] percentile: 0 th

Ascending aorta ( <  40 mm): 32 mm

EXTRACARDIAC FINDINGS:

Limited view of the lung parenchyma demonstrates 5 mm nodule in the
LEFT lower lobe (image 77/9). Airways are normal.

Limited view of the mediastinum demonstrates no adenopathy.
Esophagus normal.

Limited view of the upper abdomen unremarkable.

Limited view of the skeleton and chest wall is unremarkable.
IMPRESSION: 1. No coronary artery calcification.

2. Total Agatston Score: 0

3. MESA age and sex matched database percentile: 0th

4. LEFT lobe pulmonary nodule. No follow-up needed if patient is
low-risk. Non-contrast chest CT can be considered in 12 months if
patient is high-risk. This recommendation follows the consensus
statement: Guidelines for Management of Incidental Pulmonary Nodules
Detected on CT Images: From the [HOSPITAL] 2126; Radiology
2126; [DATE].

These results will be called to the ordering clinician or
representative by the Radiologist Assistant, and communication
documented in the PACS or zVision Dashboard.

## 2020-02-05 DIAGNOSIS — L400 Psoriasis vulgaris: Secondary | ICD-10-CM | POA: Diagnosis not present

## 2020-02-11 DIAGNOSIS — A0472 Enterocolitis due to Clostridium difficile, not specified as recurrent: Secondary | ICD-10-CM | POA: Diagnosis not present

## 2020-02-16 DIAGNOSIS — Z23 Encounter for immunization: Secondary | ICD-10-CM | POA: Diagnosis not present

## 2020-02-23 ENCOUNTER — Telehealth: Payer: Self-pay | Admitting: Podiatry

## 2020-02-23 ENCOUNTER — Telehealth: Payer: Self-pay | Admitting: *Deleted

## 2020-02-23 NOTE — Telephone Encounter (Signed)
I spoke to pt and she states she is better today and won't be able to come in to see Dr. Milinda Pointer until 03/2020. Pt states when she was walking around in the cold barefooted she felt a pain and had kind of maneuvered weird and the area hurt. I told pt that the basic treatment at home was ice 3-4 times a day foar 15 minutes/session protecting the skin from the ice with a light towel, and good supportive shoes like New Balance, Asics and Sacony in their runner's series. I told pt that a non- aggressive stretch like straightening the leg and pushing the heel out and pointing the toes back to her would help preempt the sudden tearing, stretching sensation she got when getting up, due to the plantar fascia not being as malleable due to the inflammation. Pt states she can not take antiinflammatory medication due to they bother her stomach.

## 2020-02-23 NOTE — Telephone Encounter (Signed)
Pt called to see if she can get some advice for heel pain until her appt with hyatt 04/08/20 any advice

## 2020-02-23 NOTE — Telephone Encounter (Signed)
Pt states she is having pain in her heel and feels from what she read she has plantar fasciitis and wanted to know how to treat it or if she needed an appt.

## 2020-02-27 DIAGNOSIS — L400 Psoriasis vulgaris: Secondary | ICD-10-CM | POA: Diagnosis not present

## 2020-03-05 DIAGNOSIS — L400 Psoriasis vulgaris: Secondary | ICD-10-CM | POA: Diagnosis not present

## 2020-03-08 DIAGNOSIS — Z23 Encounter for immunization: Secondary | ICD-10-CM | POA: Diagnosis not present

## 2020-03-16 DIAGNOSIS — I10 Essential (primary) hypertension: Secondary | ICD-10-CM | POA: Diagnosis not present

## 2020-03-16 DIAGNOSIS — M797 Fibromyalgia: Secondary | ICD-10-CM | POA: Diagnosis not present

## 2020-03-19 DIAGNOSIS — L400 Psoriasis vulgaris: Secondary | ICD-10-CM | POA: Diagnosis not present

## 2020-03-25 DIAGNOSIS — L400 Psoriasis vulgaris: Secondary | ICD-10-CM | POA: Diagnosis not present

## 2020-03-29 DIAGNOSIS — H9319 Tinnitus, unspecified ear: Secondary | ICD-10-CM | POA: Diagnosis not present

## 2020-03-29 DIAGNOSIS — J302 Other seasonal allergic rhinitis: Secondary | ICD-10-CM | POA: Diagnosis not present

## 2020-03-29 DIAGNOSIS — H6123 Impacted cerumen, bilateral: Secondary | ICD-10-CM | POA: Diagnosis not present

## 2020-03-31 DIAGNOSIS — L400 Psoriasis vulgaris: Secondary | ICD-10-CM | POA: Diagnosis not present

## 2020-04-05 DIAGNOSIS — E041 Nontoxic single thyroid nodule: Secondary | ICD-10-CM | POA: Diagnosis not present

## 2020-04-05 DIAGNOSIS — M810 Age-related osteoporosis without current pathological fracture: Secondary | ICD-10-CM | POA: Diagnosis not present

## 2020-04-05 DIAGNOSIS — E7849 Other hyperlipidemia: Secondary | ICD-10-CM | POA: Diagnosis not present

## 2020-04-06 DIAGNOSIS — L400 Psoriasis vulgaris: Secondary | ICD-10-CM | POA: Diagnosis not present

## 2020-04-06 DIAGNOSIS — M810 Age-related osteoporosis without current pathological fracture: Secondary | ICD-10-CM | POA: Diagnosis not present

## 2020-04-08 ENCOUNTER — Ambulatory Visit (INDEPENDENT_AMBULATORY_CARE_PROVIDER_SITE_OTHER): Payer: Medicare Other | Admitting: Podiatry

## 2020-04-08 ENCOUNTER — Ambulatory Visit (INDEPENDENT_AMBULATORY_CARE_PROVIDER_SITE_OTHER): Payer: Medicare Other

## 2020-04-08 ENCOUNTER — Other Ambulatory Visit: Payer: Self-pay

## 2020-04-08 DIAGNOSIS — G5793 Unspecified mononeuropathy of bilateral lower limbs: Secondary | ICD-10-CM | POA: Diagnosis not present

## 2020-04-08 DIAGNOSIS — M722 Plantar fascial fibromatosis: Secondary | ICD-10-CM

## 2020-04-08 NOTE — Progress Notes (Signed)
She presents today for bilateral burning and throbbing to the plantar aspect of the foot.  States that frozen peas really help her get through the night states that when she first goes to bed she does not have any pain but she is woken up in the night with her feet throbbing and burning.  She states that she has had a bout of plantar fasciitis to her left heel but it has since resolved.  She states that both feet feel about the same she denies any history of trauma denies any back issues.  Objective: Vital signs are stable alert and oriented x3 there is no erythema edema cellulitis drainage odor pulses are palpable.  Neurologic sensorium appears to be intact deep tendon reflexes are intact muscle strength is normal symmetrical.  She has cavus foot deformities with fat pad atrophy to the heel and the forefoot.  Radiographs do not demonstrate any type of acute abnormalities.  Assessment: Idiopathic neuropathy or familial neuropathy.  Plan: Offered her 100 mg gabapentin which she does not want to take at this time we did discuss appropriate shoe gear stretching exercise ice therapy and shoe gear modifications.

## 2020-04-12 DIAGNOSIS — L400 Psoriasis vulgaris: Secondary | ICD-10-CM | POA: Diagnosis not present

## 2020-04-12 DIAGNOSIS — R82998 Other abnormal findings in urine: Secondary | ICD-10-CM | POA: Diagnosis not present

## 2020-04-12 DIAGNOSIS — I10 Essential (primary) hypertension: Secondary | ICD-10-CM | POA: Diagnosis not present

## 2020-04-13 DIAGNOSIS — E785 Hyperlipidemia, unspecified: Secondary | ICD-10-CM | POA: Diagnosis not present

## 2020-04-13 DIAGNOSIS — L409 Psoriasis, unspecified: Secondary | ICD-10-CM | POA: Diagnosis not present

## 2020-04-13 DIAGNOSIS — Z0000 Encounter for general adult medical examination without abnormal findings: Secondary | ICD-10-CM | POA: Diagnosis not present

## 2020-04-13 DIAGNOSIS — Z1331 Encounter for screening for depression: Secondary | ICD-10-CM | POA: Diagnosis not present

## 2020-04-13 DIAGNOSIS — K219 Gastro-esophageal reflux disease without esophagitis: Secondary | ICD-10-CM | POA: Diagnosis not present

## 2020-04-13 DIAGNOSIS — E041 Nontoxic single thyroid nodule: Secondary | ICD-10-CM | POA: Diagnosis not present

## 2020-04-13 DIAGNOSIS — M810 Age-related osteoporosis without current pathological fracture: Secondary | ICD-10-CM | POA: Diagnosis not present

## 2020-04-13 DIAGNOSIS — I10 Essential (primary) hypertension: Secondary | ICD-10-CM | POA: Diagnosis not present

## 2020-04-13 DIAGNOSIS — M797 Fibromyalgia: Secondary | ICD-10-CM | POA: Diagnosis not present

## 2020-04-14 DIAGNOSIS — Z1212 Encounter for screening for malignant neoplasm of rectum: Secondary | ICD-10-CM | POA: Diagnosis not present

## 2020-04-26 DIAGNOSIS — L400 Psoriasis vulgaris: Secondary | ICD-10-CM | POA: Diagnosis not present

## 2020-05-06 DIAGNOSIS — L400 Psoriasis vulgaris: Secondary | ICD-10-CM | POA: Diagnosis not present

## 2020-05-06 DIAGNOSIS — R194 Change in bowel habit: Secondary | ICD-10-CM | POA: Diagnosis not present

## 2020-05-11 DIAGNOSIS — A0472 Enterocolitis due to Clostridium difficile, not specified as recurrent: Secondary | ICD-10-CM | POA: Diagnosis not present

## 2020-05-13 DIAGNOSIS — L400 Psoriasis vulgaris: Secondary | ICD-10-CM | POA: Diagnosis not present

## 2020-05-26 DIAGNOSIS — L400 Psoriasis vulgaris: Secondary | ICD-10-CM | POA: Diagnosis not present

## 2020-05-31 DIAGNOSIS — M797 Fibromyalgia: Secondary | ICD-10-CM | POA: Diagnosis not present

## 2020-05-31 DIAGNOSIS — Z8619 Personal history of other infectious and parasitic diseases: Secondary | ICD-10-CM | POA: Diagnosis not present

## 2020-05-31 DIAGNOSIS — K5792 Diverticulitis of intestine, part unspecified, without perforation or abscess without bleeding: Secondary | ICD-10-CM | POA: Diagnosis not present

## 2020-05-31 DIAGNOSIS — E279 Disorder of adrenal gland, unspecified: Secondary | ICD-10-CM | POA: Diagnosis not present

## 2020-05-31 DIAGNOSIS — R634 Abnormal weight loss: Secondary | ICD-10-CM | POA: Diagnosis not present

## 2020-05-31 DIAGNOSIS — M810 Age-related osteoporosis without current pathological fracture: Secondary | ICD-10-CM | POA: Diagnosis not present

## 2020-05-31 DIAGNOSIS — I10 Essential (primary) hypertension: Secondary | ICD-10-CM | POA: Diagnosis not present

## 2020-05-31 DIAGNOSIS — Z7689 Persons encountering health services in other specified circumstances: Secondary | ICD-10-CM | POA: Diagnosis not present

## 2020-05-31 DIAGNOSIS — L409 Psoriasis, unspecified: Secondary | ICD-10-CM | POA: Diagnosis not present

## 2020-05-31 DIAGNOSIS — G4709 Other insomnia: Secondary | ICD-10-CM | POA: Diagnosis not present

## 2020-06-03 DIAGNOSIS — L400 Psoriasis vulgaris: Secondary | ICD-10-CM | POA: Diagnosis not present

## 2020-06-07 DIAGNOSIS — L400 Psoriasis vulgaris: Secondary | ICD-10-CM | POA: Diagnosis not present

## 2020-07-12 DIAGNOSIS — L400 Psoriasis vulgaris: Secondary | ICD-10-CM | POA: Diagnosis not present

## 2020-07-13 DIAGNOSIS — A0472 Enterocolitis due to Clostridium difficile, not specified as recurrent: Secondary | ICD-10-CM | POA: Diagnosis not present

## 2020-07-14 DIAGNOSIS — M2669 Other specified disorders of temporomandibular joint: Secondary | ICD-10-CM | POA: Diagnosis not present

## 2020-07-14 DIAGNOSIS — H6981 Other specified disorders of Eustachian tube, right ear: Secondary | ICD-10-CM | POA: Diagnosis not present

## 2020-07-21 DIAGNOSIS — L400 Psoriasis vulgaris: Secondary | ICD-10-CM | POA: Diagnosis not present

## 2020-07-28 DIAGNOSIS — L400 Psoriasis vulgaris: Secondary | ICD-10-CM | POA: Diagnosis not present

## 2020-08-05 DIAGNOSIS — I10 Essential (primary) hypertension: Secondary | ICD-10-CM | POA: Diagnosis not present

## 2020-08-05 DIAGNOSIS — M810 Age-related osteoporosis without current pathological fracture: Secondary | ICD-10-CM | POA: Diagnosis not present

## 2020-08-05 DIAGNOSIS — G4709 Other insomnia: Secondary | ICD-10-CM | POA: Diagnosis not present

## 2020-08-05 DIAGNOSIS — L409 Psoriasis, unspecified: Secondary | ICD-10-CM | POA: Diagnosis not present

## 2020-08-05 DIAGNOSIS — K5792 Diverticulitis of intestine, part unspecified, without perforation or abscess without bleeding: Secondary | ICD-10-CM | POA: Diagnosis not present

## 2020-08-05 DIAGNOSIS — M797 Fibromyalgia: Secondary | ICD-10-CM | POA: Diagnosis not present

## 2020-08-05 DIAGNOSIS — R634 Abnormal weight loss: Secondary | ICD-10-CM | POA: Diagnosis not present

## 2020-08-05 DIAGNOSIS — Z8619 Personal history of other infectious and parasitic diseases: Secondary | ICD-10-CM | POA: Diagnosis not present

## 2020-08-11 DIAGNOSIS — L400 Psoriasis vulgaris: Secondary | ICD-10-CM | POA: Diagnosis not present

## 2020-08-12 ENCOUNTER — Other Ambulatory Visit: Payer: Self-pay | Admitting: Geriatric Medicine

## 2020-08-12 DIAGNOSIS — E041 Nontoxic single thyroid nodule: Secondary | ICD-10-CM

## 2020-08-18 ENCOUNTER — Ambulatory Visit
Admission: RE | Admit: 2020-08-18 | Discharge: 2020-08-18 | Disposition: A | Discharge status: Home / Self Care | Payer: Medicare Other | Source: Ambulatory Visit | Attending: Geriatric Medicine | Admitting: Geriatric Medicine

## 2020-08-18 DIAGNOSIS — E041 Nontoxic single thyroid nodule: Secondary | ICD-10-CM

## 2020-08-19 DIAGNOSIS — L400 Psoriasis vulgaris: Secondary | ICD-10-CM | POA: Diagnosis not present

## 2020-08-27 DIAGNOSIS — R110 Nausea: Secondary | ICD-10-CM | POA: Diagnosis not present

## 2020-08-27 DIAGNOSIS — K2900 Acute gastritis without bleeding: Secondary | ICD-10-CM | POA: Diagnosis not present

## 2020-09-02 DIAGNOSIS — L400 Psoriasis vulgaris: Secondary | ICD-10-CM | POA: Diagnosis not present

## 2020-09-10 DIAGNOSIS — L400 Psoriasis vulgaris: Secondary | ICD-10-CM | POA: Diagnosis not present

## 2020-09-17 DIAGNOSIS — H6123 Impacted cerumen, bilateral: Secondary | ICD-10-CM | POA: Diagnosis not present

## 2020-09-19 DIAGNOSIS — Z23 Encounter for immunization: Secondary | ICD-10-CM | POA: Diagnosis not present

## 2020-09-22 DIAGNOSIS — L400 Psoriasis vulgaris: Secondary | ICD-10-CM | POA: Diagnosis not present

## 2020-10-01 DIAGNOSIS — L400 Psoriasis vulgaris: Secondary | ICD-10-CM | POA: Diagnosis not present

## 2020-10-06 DIAGNOSIS — L400 Psoriasis vulgaris: Secondary | ICD-10-CM | POA: Diagnosis not present

## 2020-10-13 DIAGNOSIS — L400 Psoriasis vulgaris: Secondary | ICD-10-CM | POA: Diagnosis not present

## 2020-10-19 DIAGNOSIS — L718 Other rosacea: Secondary | ICD-10-CM | POA: Diagnosis not present

## 2020-10-19 DIAGNOSIS — L821 Other seborrheic keratosis: Secondary | ICD-10-CM | POA: Diagnosis not present

## 2020-10-19 DIAGNOSIS — L400 Psoriasis vulgaris: Secondary | ICD-10-CM | POA: Diagnosis not present

## 2020-10-20 DIAGNOSIS — L400 Psoriasis vulgaris: Secondary | ICD-10-CM | POA: Diagnosis not present

## 2020-10-20 DIAGNOSIS — C8400 Mycosis fungoides, unspecified site: Secondary | ICD-10-CM | POA: Diagnosis not present

## 2020-10-20 DIAGNOSIS — L441 Lichen nitidus: Secondary | ICD-10-CM | POA: Diagnosis not present

## 2020-10-25 DIAGNOSIS — L400 Psoriasis vulgaris: Secondary | ICD-10-CM | POA: Diagnosis not present

## 2020-10-26 DIAGNOSIS — I10 Essential (primary) hypertension: Secondary | ICD-10-CM | POA: Diagnosis not present

## 2020-10-26 DIAGNOSIS — K219 Gastro-esophageal reflux disease without esophagitis: Secondary | ICD-10-CM | POA: Diagnosis not present

## 2020-10-26 DIAGNOSIS — M79673 Pain in unspecified foot: Secondary | ICD-10-CM | POA: Diagnosis not present

## 2020-10-26 DIAGNOSIS — M778 Other enthesopathies, not elsewhere classified: Secondary | ICD-10-CM | POA: Diagnosis not present

## 2020-11-02 DIAGNOSIS — L400 Psoriasis vulgaris: Secondary | ICD-10-CM | POA: Diagnosis not present

## 2020-11-05 DIAGNOSIS — L400 Psoriasis vulgaris: Secondary | ICD-10-CM | POA: Diagnosis not present

## 2020-11-09 DIAGNOSIS — L400 Psoriasis vulgaris: Secondary | ICD-10-CM | POA: Diagnosis not present

## 2020-11-12 DIAGNOSIS — L400 Psoriasis vulgaris: Secondary | ICD-10-CM | POA: Diagnosis not present

## 2020-11-16 DIAGNOSIS — L400 Psoriasis vulgaris: Secondary | ICD-10-CM | POA: Diagnosis not present

## 2020-11-19 ENCOUNTER — Emergency Department (HOSPITAL_BASED_OUTPATIENT_CLINIC_OR_DEPARTMENT_OTHER)
Admission: EM | Admit: 2020-11-19 | Discharge: 2020-11-19 | Disposition: A | Discharge status: Home / Self Care | Payer: Medicare Other | Attending: Emergency Medicine | Admitting: Emergency Medicine

## 2020-11-19 ENCOUNTER — Other Ambulatory Visit: Payer: Self-pay

## 2020-11-19 ENCOUNTER — Encounter (HOSPITAL_BASED_OUTPATIENT_CLINIC_OR_DEPARTMENT_OTHER): Payer: Self-pay | Admitting: *Deleted

## 2020-11-19 DIAGNOSIS — R1084 Generalized abdominal pain: Secondary | ICD-10-CM | POA: Diagnosis not present

## 2020-11-19 DIAGNOSIS — R197 Diarrhea, unspecified: Secondary | ICD-10-CM

## 2020-11-19 DIAGNOSIS — A09 Infectious gastroenteritis and colitis, unspecified: Secondary | ICD-10-CM | POA: Insufficient documentation

## 2020-11-19 DIAGNOSIS — Z20822 Contact with and (suspected) exposure to covid-19: Secondary | ICD-10-CM | POA: Diagnosis not present

## 2020-11-19 DIAGNOSIS — Z79899 Other long term (current) drug therapy: Secondary | ICD-10-CM | POA: Insufficient documentation

## 2020-11-19 DIAGNOSIS — Z87891 Personal history of nicotine dependence: Secondary | ICD-10-CM | POA: Insufficient documentation

## 2020-11-19 DIAGNOSIS — R110 Nausea: Secondary | ICD-10-CM | POA: Diagnosis not present

## 2020-11-19 DIAGNOSIS — Z91040 Latex allergy status: Secondary | ICD-10-CM | POA: Insufficient documentation

## 2020-11-19 DIAGNOSIS — R109 Unspecified abdominal pain: Secondary | ICD-10-CM | POA: Diagnosis not present

## 2020-11-19 DIAGNOSIS — E876 Hypokalemia: Secondary | ICD-10-CM | POA: Diagnosis not present

## 2020-11-19 DIAGNOSIS — R509 Fever, unspecified: Secondary | ICD-10-CM | POA: Diagnosis not present

## 2020-11-19 LAB — COMPREHENSIVE METABOLIC PANEL
ALT: 17 U/L (ref 0–44)
AST: 27 U/L (ref 15–41)
Albumin: 3.9 g/dL (ref 3.5–5.0)
Alkaline Phosphatase: 69 U/L (ref 38–126)
Anion gap: 11 (ref 5–15)
BUN: 15 mg/dL (ref 8–23)
CO2: 24 mmol/L (ref 22–32)
Calcium: 8.8 mg/dL — ABNORMAL LOW (ref 8.9–10.3)
Chloride: 99 mmol/L (ref 98–111)
Creatinine, Ser: 0.92 mg/dL (ref 0.44–1.00)
GFR, Estimated: >60 mL/min (ref >60)
Glucose, Bld: 108 mg/dL — ABNORMAL HIGH (ref 70–99)
Potassium: 3.2 mmol/L — ABNORMAL LOW (ref 3.5–5.1)
Sodium: 134 mmol/L — ABNORMAL LOW (ref 135–145)
Total Bilirubin: 1.2 mg/dL (ref 0.3–1.2)
Total Protein: 6.7 g/dL (ref 6.5–8.1)

## 2020-11-19 LAB — CBC WITH DIFFERENTIAL/PLATELET
Abs Immature Granulocytes: 0.04 10*3/uL (ref 0.00–0.07)
Basophils Absolute: 0 10*3/uL (ref 0.0–0.1)
Basophils Relative: 0 %
Eosinophils Absolute: 0 10*3/uL (ref 0.0–0.5)
Eosinophils Relative: 0 %
HCT: 39.4 % (ref 36.0–46.0)
Hemoglobin: 13.3 g/dL (ref 12.0–15.0)
Immature Granulocytes: 0 %
Lymphocytes Relative: 6 %
Lymphs Abs: 0.7 10*3/uL (ref 0.7–4.0)
MCH: 29.8 pg (ref 26.0–34.0)
MCHC: 33.8 g/dL (ref 30.0–36.0)
MCV: 88.1 fL (ref 80.0–100.0)
Monocytes Absolute: 0.8 10*3/uL (ref 0.1–1.0)
Monocytes Relative: 7 %
Neutro Abs: 9.3 10*3/uL — ABNORMAL HIGH (ref 1.7–7.7)
Neutrophils Relative %: 87 %
Platelets: 149 10*3/uL — ABNORMAL LOW (ref 150–400)
RBC: 4.47 MIL/uL (ref 3.87–5.11)
RDW: 12.5 % (ref 11.5–15.5)
Smear Review: NORMAL
WBC: 10.8 10*3/uL — ABNORMAL HIGH (ref 4.0–10.5)
nRBC: 0 % (ref 0.0–0.2)

## 2020-11-19 LAB — LIPASE, BLOOD: Lipase: 25 U/L (ref 11–51)

## 2020-11-19 LAB — LACTIC ACID, PLASMA
Lactic Acid, Venous: 1.1 mmol/L (ref 0.5–1.9)
Lactic Acid, Venous: 1.9 mmol/L (ref 0.5–1.9)

## 2020-11-19 MED ORDER — POTASSIUM CHLORIDE CRYS ER 20 MEQ PO TBCR: 20.0000 meq | EXTENDED_RELEASE_TABLET | Freq: Two times a day (BID) | ORAL | 0 refills | Status: DC

## 2020-11-19 MED ORDER — POTASSIUM CHLORIDE CRYS ER 20 MEQ PO TBCR
40.0000 meq | EXTENDED_RELEASE_TABLET | Freq: Once | ORAL | Status: AC
  Administered 2020-11-19: 40 meq via ORAL
  Filled 2020-11-19: qty 2

## 2020-11-19 MED ORDER — LACTATED RINGERS IV BOLUS
1000.0000 mL | Freq: Once | INTRAVENOUS | Status: AC
  Administered 2020-11-19: 1000 mL via INTRAVENOUS

## 2020-11-19 NOTE — ED Provider Notes (Signed)
[facility] EMERGENCY DEPARTMENT Provider Note   CSN: [id number] Arrival date & time: 11/19/20  1509     History Chief Complaint  Patient presents with  . Diarrhea    Denise Young is a 69 y.o. female.  HPI Patient reports he started getting diarrhea yesterday.  Reports he had about 10 episodes yesterday and 10 episodes today.  Reports she saw some stool that had some blood mixed in but not much.  No vomiting.  No significant abdominal pain.  She reports a little cramping discomfort in the lower abdomen but is not severe.  Patient reports that she has had a history of C. difficile twice this year.  She was concerned about the possibility of having this back.  She has not recently been on antibiotics.  She went to urgent care and was advised to come to the emergency department.  They also expressed concern for possible bowel perforation.  Patient had a diverticular duodenal perforation prior to ever having had C. difficile.  She reports the pain with that was much more intense and much different than what she is experiencing today.    Past Medical History:  Diagnosis Date  . Atrophic vaginitis 03/20/2013  . ELEVATED BP READING WITHOUT DX HYPERTENSION 10/04/2010  . FIBROMYALGIA 10/04/2010  . IBS 10/26/2010  . Osteopenia   . Osteoporosis    in the spine  . Other anxiety states 10/26/2010  . PALPITATIONS, RECURRENT 10/26/2010  . Perforated diverticulum 07/2019  . RHINITIS 11/23/2010  . SINUSITIS - ACUTE-NOS 11/28/2010  . SLEEP DISORDER/DISTURBANCE 10/04/2010  . THYROID NODULE 10/04/2010    Patient Active Problem List   Diagnosis Date Noted  . Perforated diverticulum of duodenum 08/17/2019  . Essential hypertension 11/28/2016  . Psoriasis 09/21/2016  . Osteoporosis 04/01/2014  . Atrophic vaginitis 03/20/2013  . IBS 10/26/2010  . PALPITATIONS, RECURRENT 10/26/2010  . Thyroid nodule 10/04/2010  . FIBROMYALGIA 10/04/2010  . Disturbance in sleep behavior 10/04/2010  .  ELEVATED BP READING WITHOUT DX HYPERTENSION 10/04/2010    Past Surgical History:  Procedure Laterality Date  . BREAST SURGERY  12/25/1990 and 12/25/1992   2 Done on Rt breast  . CESAREAN SECTION  12/26/1987  . DILATION AND CURETTAGE OF UTERUS  2004  . ESOPHAGOGASTRODUODENOSCOPY ENDOSCOPY  10/22/2019  . TEMPOROMANDIBULAR JOINT SURGERY  12/25/1993     OB History    Gravida  1   Para  1   Term      Preterm      AB      Living  1     SAB      TAB      Ectopic      Multiple      Live Births              Family History  Problem Relation Age of Onset  . Heart disease Father   . Stroke Father   . Hypertension Mother        Mild  . Hypothyroidism Mother   . Migraines Sister   . Hypertension Sister   . Hypothyroidism Brother   . Cancer Brother        Cancer of neck  . Hypothyroidism Sister   . Hypertension Brother   . Diabetes Brother   . Heart disease Brother        Open Heart Disease  . Hyperlipidemia Brother   . Hypertension Brother   . Cancer Maternal Grandfather        pancreatic  .  Osteoporosis Sister        hypercalciuria    Social History   Tobacco Use  . Smoking status: Former Research scientist (life sciences)  . Smokeless tobacco: Never Used  . Tobacco comment: quit in 1982  Vaping Use  . Vaping Use: Never used  Substance Use Topics  . Alcohol use: No  . Drug use: No    Home Medications Prior to Admission medications   Medication Sig Start Date End Date Taking? Authorizing Provider  Cobalamin Combinations (Y-86 + FOLIC ACID PO) Take by mouth.    [provider]  diazepam (VALIUM) 5 MG tablet Take 1 tablet by mouth daily as needed for anxiety.  05/28/18   [provider]  hydroxypropyl methylcellulose / hypromellose (ISOPTO TEARS / GONIOVISC) 2.5 % ophthalmic solution 1 drop.    [provider]  magnesium 30 MG tablet Take 30 mg by mouth 2 (two) times daily.    [provider]  potassium chloride SA (KLOR-CON) 20 MEQ tablet  Take 1 tablet (20 mEq total) by mouth 2 (two) times daily. 11/19/20   Charlesetta Shanks, MD  propranolol (INDERAL) 10 MG tablet Take 10 mg by mouth daily.     [provider]  vancomycin (VANCOCIN) 125 MG capsule Take 125 mg by mouth 4 (four) times daily.    [provider]  Vitamin D-Vitamin K (VITAMIN K2-VITAMIN D3 PO) Take by mouth.    [provider]    Allergies    Augmentin [amoxicillin-pot clavulanate], Nsaids, Adhesive [tape], Erythromycin, Flagyl [metronidazole], Latex, Levaquin [levofloxacin in d5w], Lisinopril, Neosporin [neomycin-bacitracin zn-polymyx], Nickel, and Other  Review of Systems   Review of Systems 10 systems reviewed and negative except as per HPI Physical Exam Updated Vital Signs BP (!) 149/61   Pulse (!) 104   Temp 98.6 F (37 C) (Oral)   Resp 18   Ht 5' 3.5" (1.613 m)   Wt 54.1 kg   LMP 08/25/2004   SpO2 100%   BMI 20.78 kg/m   Physical Exam Constitutional:      Comments: Alert nontoxic clinically well in appearance.  HENT:     Head: Normocephalic and atraumatic.     Mouth/Throat:     Pharynx: Oropharynx is clear.  Eyes:     Extraocular Movements: Extraocular movements intact.  Cardiovascular:     Rate and Rhythm: Normal rate and regular rhythm.  Pulmonary:     Effort: Pulmonary effort is normal.     Breath sounds: Normal breath sounds.  Abdominal:     Comments: Abdomen is soft.  Mild discomfort to palpation lower abdomen.  No guarding.  No localizing.  Musculoskeletal:        General: No swelling or tenderness. Normal range of motion.     Right lower leg: No edema.     Left lower leg: No edema.  Skin:    General: Skin is warm and dry.  Neurological:     General: No focal deficit present.     Mental Status: She is oriented to person, place, and time.     Coordination: Coordination normal.     ED Results / Procedures / Treatments   Labs (all labs ordered are listed, but only abnormal results are  displayed) Labs Reviewed  COMPREHENSIVE METABOLIC PANEL - Abnormal; Notable for the following components:      Result Value   Sodium 134 (*)    Potassium 3.2 (*)    Glucose, Bld 108 (*)    Calcium 8.8 (*)  All other components within normal limits  CBC WITH DIFFERENTIAL/PLATELET - Abnormal; Notable for the following components:   WBC 10.8 (*)    Platelets 149 (*)    Neutro Abs 9.3 (*)    All other components within normal limits  C DIFFICILE QUICK SCREEN W PCR REFLEX  GASTROINTESTINAL PANEL BY PCR, STOOL (REPLACES STOOL CULTURE)  LIPASE, BLOOD  LACTIC ACID, PLASMA  LACTIC ACID, PLASMA    EKG None  Radiology No results found.  Procedures Procedures (including critical care time)  Medications Ordered in ED Medications  lactated ringers bolus 1,000 mL (0 mLs Intravenous Stopped 11/19/20 1919)  potassium chloride SA (KLOR-CON) CR tablet 40 mEq (40 mEq Oral Given 11/19/20 1924)    ED Course  I have reviewed the triage vital signs and the nursing notes.  Pertinent labs & imaging results that were available during my care of the patient were reviewed by me and considered in my medical decision making (see chart for details).    MDM Rules/Calculators/A&P                          Patient presents after having been seen in urgent care.  She is referred to the emergency department for concerns of possible C. difficile evaluation with history of same and also concern for possible surgical abdominal condition based on prior history of diverticular duodenal perforation.  Patient has had diarrhea for 2 days.  She is alert and nontoxic.  Borderline tachycardia at 100.  Will rehydrate with lactated Ringer's.  No diarrhea.  Patient can tolerate oral intake.  She has mild hypokalemia consistent with diarrhea.  Will replace orally.  Specimens collected for stool PCR and C. Difficile.  Results still pending.  Will discharge and patient will be contacted if treatment indicatied, and  follow-up on results with her physician.  Patient has nonsurgical abdomen and pain is not severe.  Low suspicion at this time for acute abdomen or surgical condition.  Return precautions reviewed.  Patient will follow up with Dr. Cristina Gong.  Final Clinical Impression(s) / ED Diagnoses Final diagnoses:  Diarrhea of presumed infectious origin  Hypokalemia    Rx / DC Orders ED Discharge Orders         Ordered    potassium chloride SA (KLOR-CON) 20 MEQ tablet  2 times daily        11/19/20 2134           Charlesetta Shanks, MD 11/19/20 2143

## 2020-11-19 NOTE — ED Triage Notes (Signed)
She was seen today at [facility] for diarrhea. Hx of Cdiff. She had a negative Covid and negative flu test today. She was told to come here for hx of perforated colon and test for cdiff.

## 2020-11-19 NOTE — Discharge Instructions (Addendum)
1.  The results of your C. difficile and stool test should be available by tomorrow.  Will be called if you need treatment. 2.  People often lose potassium with frequent diarrhea.  Your potassium was slightly low.  You may take potassium pills as prescribed twice daily.  Have your doctor recheck your potassium within the next 2 to 5 days. 3.  Return if you develop abdominal pain, fever, worsening symptoms or other concerning changes.

## 2020-11-20 ENCOUNTER — Telehealth (HOSPITAL_BASED_OUTPATIENT_CLINIC_OR_DEPARTMENT_OTHER): Payer: Self-pay | Admitting: Emergency Medicine

## 2020-11-20 LAB — GASTROINTESTINAL PANEL BY PCR, STOOL (REPLACES STOOL CULTURE)

## 2020-11-20 LAB — C DIFFICILE QUICK SCREEN W PCR REFLEX
C Diff antigen: POSITIVE — AB
C Diff interpretation: DETECTED
C Diff toxin: POSITIVE — AB

## 2020-11-20 MED ORDER — VANCOMYCIN HCL 125 MG PO CAPS
125.0000 mg | ORAL_CAPSULE | Freq: Four times a day (QID) | ORAL | 0 refills | Status: DC
Start: 2020-11-20 — End: 2020-11-20

## 2020-11-20 MED ORDER — VANCOMYCIN HCL 125 MG PO CAPS
125.0000 mg | ORAL_CAPSULE | Freq: Four times a day (QID) | ORAL | 0 refills | Status: DC
Start: 2020-11-20 — End: 2022-01-23

## 2020-11-20 NOTE — Telephone Encounter (Signed)
Patient C. difficile has returned positive.  Vancomycin 125 mg 4 times daily for 10 days ordered for [facility] at [employer].

## 2020-11-20 NOTE — Telephone Encounter (Signed)
Pt called into department with concerns related to potassium RX - states pills are too large and taste bad. Pt educated that pills can be dissolved in water and taken that way. Requesting alternative medication RX. Educated patient that EDP that saw her last night is not in dept tonight to give new RX. Recommended patient try dissolving medication and call PCP if that technique does not help with swallowing pill.

## 2020-11-25 DIAGNOSIS — I10 Essential (primary) hypertension: Secondary | ICD-10-CM | POA: Diagnosis not present

## 2020-11-25 DIAGNOSIS — A0472 Enterocolitis due to Clostridium difficile, not specified as recurrent: Secondary | ICD-10-CM | POA: Diagnosis not present

## 2020-11-26 DIAGNOSIS — L400 Psoriasis vulgaris: Secondary | ICD-10-CM | POA: Diagnosis not present

## 2020-12-01 ENCOUNTER — Ambulatory Visit (INDEPENDENT_AMBULATORY_CARE_PROVIDER_SITE_OTHER): Payer: Medicare Other | Admitting: Infectious Diseases

## 2020-12-01 ENCOUNTER — Telehealth: Payer: Self-pay

## 2020-12-01 ENCOUNTER — Encounter: Payer: Self-pay | Admitting: Infectious Diseases

## 2020-12-01 ENCOUNTER — Other Ambulatory Visit: Payer: Self-pay

## 2020-12-01 VITALS — BP 133/75 | HR 78 | Temp 98.0°F | Wt 120.0 lb

## 2020-12-01 DIAGNOSIS — A0471 Enterocolitis due to Clostridium difficile, recurrent: Secondary | ICD-10-CM | POA: Diagnosis not present

## 2020-12-01 DIAGNOSIS — Z23 Encounter for immunization: Secondary | ICD-10-CM | POA: Diagnosis not present

## 2020-12-01 NOTE — Progress Notes (Signed)
[facility] for Infectious Diseases                                                             [place], [place], [place], [address]                                                                  Phn. [phone]; Fax: [phone]                                                                             Date: 12/01/20  Reason for Referral: Recurrent C Diff Diarrhea  Referring Provider: Velna Hatchet  Assessment 1. Recurrent C diff diarrhea ( 2nd recurrence) 1st episode in Jan 2021 - PO Vancomycin for 10 days with prolonged taper for 6 weeks, diarrhea resolved 2nd episode in May 2021 - treated same as above with resolution of diarrhea 3rd episode in November 2021 - Completed 10 days of PO Vancomycin, now started on PO Vancomycin TD for 2 weeks, then BID for 2 weeks and daily for 2 weeks by [facility]  2. H/o diverticular perforation in August 2020  3. Health Maintenance  - Says she has received Flu vaccine and both doses of COVID vaccine, she will schedule for booster soon.   Plan -Will check if can provide financial assistance for Fidaxomicin. Patient says copay for one dose is 4000$. - I will also check with our pharmacy if Bezlotuxumab ( Zinplava) is an option in terms of insurance. This will help her prevent recurrent C diff Infection. Patient wanted to read more about it and gave information about bezlotuxumab in AVS - Continue current PO Vancomycin tapering dose as recommended by GI - I also discussed about FMT in case she continues to recur as a last resort.  - FU in 3 weeks   All questions and concerns were discussed and addressed. Patient verbalized understanding of the plan.  ___________________________________________________________________________________________________________________  HPI:  69 year old Caucasian Female with a PMH of diverticular perforation in 2020, IBS who is referred  for evaluation of C diff. Patient was admitted in August 2020 with perforated diverticulitis and received IV abx. She was also treated for acute sinusitis in December 2020 with [facility]. This was followed by her first episode of C diff in Jan 2021 when she was seen by GI Dr Cristina Gong [facility] ) and was prescribed PO vancomycin for 10 days followed by a tapering dose. She says she took PO Vancomycin for almost 6 weeks. She was fine until she had next episode of C diff diarrhea in may 2021, She was again treated similarly as in Jan with prolonged PO Vancomycin for 6 weeks. She did well until she has a recurrent episode of loose stool around Thanksgiving and had  to go to ED on 11/26 where she was tested positive for C diff. She was started on PO Vancomycin which she has completed 10 day course of PO Vancomycin QID. She was instructed to take PO Vancomycin TID for another 2 weeks followed by BID for 2 weeks, daily for 2 weeks by GI. She says today is Day 1 of the TID dosing for PO Vancomycin.   She says the diarrhea went away approx a week after starting the vancomycin. However, it used to go away in 1-2 days in her previous episodes of C diff. She denies having fevers, chills, sweats, abdominal cramps, nausea and vomiting. She denies using any antibiotics except mupirocin ointment sometimes. She is also frustrated that she is not able to afford Fidaxomicin as the copay is 4000$ for one dose.  ROS: Constitutional: Negative for fever, chills, activity change, appetite change, fatigue and unexpected weight change.  HENT: Negative for congestion, sore throat, rhinorrhea, sneezing, trouble swallowing and sinus pressure.  Eyes: Negative for photophobia and visual disturbance.  Respiratory: Negative for cough, chest tightness, shortness of breath, wheezing and stridor.  Cardiovascular: Negative for chest pain, palpitations and leg swelling.  Gastrointestinal: Negative for nausea, vomiting, abdominal  pain, diarrhea, constipation, blood in stool, abdominal distention and anal bleeding.  Genitourinary: Negative for dysuria, hematuria, flank pain and difficulty urinating.  Musculoskeletal: Negative for myalgias, back pain, joint swelling, arthralgias and gait problem.  Skin: Negative for color change, pallor, rash and wound.  Neurological: Negative for dizziness, tremors, weakness and light-headedness.  Hematological: Negative for adenopathy. Does not bruise/bleed easily.  Psychiatric/Behavioral: Negative for behavioral problems, confusion, sleep disturbance, dysphoric mood, decreased concentration and agitation.    Past Medical History:  Diagnosis Date  . Atrophic vaginitis 03/20/2013  . ELEVATED BP READING WITHOUT DX HYPERTENSION 10/04/2010  . FIBROMYALGIA 10/04/2010  . IBS 10/26/2010  . Osteopenia   . Osteoporosis    in the spine  . Other anxiety states 10/26/2010  . PALPITATIONS, RECURRENT 10/26/2010  . Perforated diverticulum 07/2019  . RHINITIS 11/23/2010  . SINUSITIS - ACUTE-NOS 11/28/2010  . SLEEP DISORDER/DISTURBANCE 10/04/2010  . THYROID NODULE 10/04/2010   Past Surgical History:  Procedure Laterality Date  . BREAST SURGERY  12/25/1990 and 12/25/1992   2 Done on Rt breast  . CESAREAN SECTION  12/26/1987  . DILATION AND CURETTAGE OF UTERUS  2004  . ESOPHAGOGASTRODUODENOSCOPY ENDOSCOPY  10/22/2019  . TEMPOROMANDIBULAR JOINT SURGERY  12/25/1993   Current Outpatient Medications on File Prior to Visit  Medication Sig Dispense Refill  . Cobalamin Combinations (D-53 + FOLIC ACID PO) Take by mouth.    . diazepam (VALIUM) 5 MG tablet Take 1 tablet by mouth daily as needed for anxiety.   0  . hydroxypropyl methylcellulose / hypromellose (ISOPTO TEARS / GONIOVISC) 2.5 % ophthalmic solution 1 drop.    . magnesium 30 MG tablet Take 30 mg by mouth 2 (two) times daily.    . mupirocin ointment (BACTROBAN) 2 % mupirocin 2 % topical ointment    . potassium chloride SA (KLOR-CON) 20 MEQ  tablet Take 1 tablet (20 mEq total) by mouth 2 (two) times daily. 14 tablet 0  . propranolol (INDERAL) 10 MG tablet Take 10 mg by mouth daily.     Marland Kitchen saccharomyces boulardii (FLORASTOR) 250 MG capsule Take 250 mg by mouth in the morning, at noon, and at bedtime.    . vancomycin (VANCOCIN) 125 MG capsule Take 1 capsule (125 mg total) by mouth 4 (four) times daily. [place]  capsule 0  . Vitamin D-Vitamin K (VITAMIN K2-VITAMIN D3 PO) Take by mouth.     No current facility-administered medications on file prior to visit.   Allergies  Allergen Reactions  . Augmentin [Amoxicillin-Pot Clavulanate] Nausea And Vomiting    Vomiting   . Nsaids Other (See Comments)    Uncoded Allergy. Allergen: steriods, Other Reaction: AGITATION & SLEEPLESNESS  . Adhesive [Tape]   . Erythromycin   . Flagyl [Metronidazole]     rash  . Latex Other (See Comments)    Redness and irritation  . Levaquin [Levofloxacin In D5w]     Rapid heart beat  . Lisinopril   . Neosporin [Neomycin-Bacitracin Zn-Polymyx]   . Nickel   . Other     Steroid sensitive, pt feels agitated and can't sleep.   Social History   Socioeconomic History  . Marital status: Married    Spouse name: Not on file  . Number of children: Not on file  . Years of education: Not on file  . Highest education level: Not on file  Occupational History  . Not on file  Tobacco Use  . Smoking status: Former Research scientist (life sciences)  . Smokeless tobacco: Never Used  . Tobacco comment: quit in 1982  Vaping Use  . Vaping Use: Never used  Substance and Sexual Activity  . Alcohol use: No  . Drug use: No  . Sexual activity: Yes    Partners: Male  Other Topics Concern  . Not on file  Social History Narrative  . Not on file   Social Determinants of Health   Financial Resource Strain:   . Difficulty of Paying Living Expenses: Not on file  Food Insecurity:   . Worried About Charity fundraiser in the Last Year: Not on file  . Ran Out of Food in the Last Year: Not on file   Transportation Needs:   . Lack of Transportation (Medical): Not on file  . Lack of Transportation (Non-Medical): Not on file  Physical Activity:   . Days of Exercise per Week: Not on file  . Minutes of Exercise per Session: Not on file  Stress:   . Feeling of Stress : Not on file  Social Connections:   . Frequency of Communication with Friends and Family: Not on file  . Frequency of Social Gatherings with Friends and Family: Not on file  . Attends Religious Services: Not on file  . Active Member of Clubs or Organizations: Not on file  . Attends Archivist Meetings: Not on file  . Marital Status: Not on file  Intimate Partner Violence:   . Fear of Current or Ex-Partner: Not on file  . Emotionally Abused: Not on file  . Physically Abused: Not on file  . Sexually Abused: Not on file     Vitals BP 133/75   Pulse 78   Temp 98 F (36.7 C) (Oral)   Wt 120 lb (54.4 kg)   LMP 08/25/2004   BMI 20.92 kg/m    Examination  General - not in acute distress, comfortably sitting in chair HEENT - PEERLA, no pallor and no icterus, no oral candidiasis Chest - b/l clear air entry, no additional sounds CVS- Normal s1s2, RRR Abdomen - Soft, Non tender , non distended, BS normal Ext- no pedal edema Neuro: grossly normal Back - WNL Psych : calm and cooperative   Recent labs CBC Latest Ref Rng & Units 11/19/2020 01/09/2020 08/20/2019  WBC 4.0 - 10.5 K/uL 10.8(H) 10.2 5.7  Hemoglobin  12.0 - 15.0 g/dL 13.3 14.1 10.3(L)  Hematocrit 36 - 46 % 39.4 44.3 32.0(L)  Platelets 150 - 400 K/uL 149(L) 262 128(L)   CMP Latest Ref Rng & Units 11/19/2020 01/09/2020 08/20/2019  Glucose 70 - 99 mg/dL 108(H) 106(H) 106(H)  BUN 8 - 23 mg/dL 15 9 5(L)  Creatinine 0.44 - 1.00 mg/dL 0.92 0.78 0.79  Sodium 135 - 145 mmol/L 134(L) 141 137  Potassium 3.5 - 5.1 mmol/L 3.2(L) 3.5 3.6  Chloride 98 - 111 mmol/L 99 101 106  CO2 22 - 32 mmol/L 24 30 25   Calcium 8.9 - 10.3 mg/dL 8.8(L) 9.2 8.2(L)  Total  Protein 6.5 - 8.1 g/dL 6.7 7.1 -  Total Bilirubin 0.3 - 1.2 mg/dL 1.2 1.0 -  Alkaline Phos 38 - 126 U/L 69 95 -  AST 15 - 41 U/L 27 27 -  ALT 0 - 44 U/L 17 20 -     Pertinent Microbiology Results for orders placed or performed during the hospital encounter of 11/19/20  C Difficile Quick Screen w PCR reflex     Status: Abnormal   Collection Time: 11/19/20  8:04 PM   Specimen: STOOL  Result Value Ref Range Status   C Diff antigen POSITIVE (A) NEGATIVE Final   C Diff toxin POSITIVE (A) NEGATIVE Final   C Diff interpretation Toxin producing C. difficile detected.  Final    Comment: RESULT CALLED TO, READ BACK BY AND VERIFIED WITH: L ADKINS RN 11/20/20 0000 JDW Performed at [facility], [address], [place], [place] [address]   Gastrointestinal Panel by PCR , Stool     Status: None   Collection Time: 11/19/20  8:04 PM   Specimen: STOOL  Result Value Ref Range Status   Campylobacter species NOT DETECTED NOT DETECTED Final   Plesimonas shigelloides NOT DETECTED NOT DETECTED Final   Salmonella species NOT DETECTED NOT DETECTED Final   Yersinia enterocolitica NOT DETECTED NOT DETECTED Final   Vibrio species NOT DETECTED NOT DETECTED Final   Vibrio cholerae NOT DETECTED NOT DETECTED Final   Enteroaggregative E coli (EAEC) NOT DETECTED NOT DETECTED Final   Enteropathogenic E coli (EPEC) NOT DETECTED NOT DETECTED Final   Enterotoxigenic E coli (ETEC) NOT DETECTED NOT DETECTED Final   Shiga like toxin producing E coli (STEC) NOT DETECTED NOT DETECTED Final   Shigella/Enteroinvasive E coli (EIEC) NOT DETECTED NOT DETECTED Final   Cryptosporidium NOT DETECTED NOT DETECTED Final   Cyclospora cayetanensis NOT DETECTED NOT DETECTED Final   Entamoeba histolytica NOT DETECTED NOT DETECTED Final   Giardia lamblia NOT DETECTED NOT DETECTED Final   Adenovirus F40/41 NOT DETECTED NOT DETECTED Final   Astrovirus NOT DETECTED NOT DETECTED Final   Norovirus GI/GII NOT DETECTED NOT DETECTED  Final   Rotavirus A NOT DETECTED NOT DETECTED Final   Sapovirus (I, II, IV, and V) NOT DETECTED NOT DETECTED Final    Comment: Performed at [facility], [address], [place],  [address]     All pertinent labs/Imagings/notes reviewed. All pertinent plain films and CT images have been personally visualized and interpreted; radiology reports have been reviewed. Decision making incorporated into the Impression / Recommendations.  I spent greater than 45 minutes with the patient including  review of prior medical records with greater than 50% of time in face to face counsel of the patient.    Electronically signed by:  Rosiland Oz, MD Infectious Disease Physician [facility] Infectious Disease [address] [place], [place] [address] Phone: [phone]  Fax: [phone]

## 2020-12-01 NOTE — Assessment & Plan Note (Signed)
Continue tapering dose of PO vancomycin Diarrhea resolved currently Check with pharmacy for Bezlotuxumab Check for financial assistance for Fidaxomicin

## 2020-12-01 NOTE — Assessment & Plan Note (Signed)
Has received flu vaccine and both doses of COVID vaccine. She says she will schedule for a booster dose

## 2020-12-01 NOTE — Telephone Encounter (Signed)
[facility] Patient Advocate Encounter  Completed and sent [employer] application for DIFICID for this patient who have Medicare Part D.  Patient Co-Pay is $1184.00.    Patient assistance phone number for follow up is [phone].   This encounter will be updated until final determination.     Ileene Patrick, [facility] Patient [facility] for Infectious Disease Phone: [phone] Fax:  [phone]

## 2020-12-01 NOTE — Patient Instructions (Signed)
Bezlotoxumab: Patient drug information Access [employer] Online for additional drug information, tools, and databases. Copyright [id number] [employer] All rights reserved. (For additional information see "Bezlotoxumab: Drug information")  You must carefully read the "Consumer Information Use and Disclaimer" below in order to understand and correctly use this information. Brand Names: [place]  Zinplava  What is this drug used for?   It is used to lower the chance of a type of bacterial infection called C diff from coming back.  What do I need to tell my doctor BEFORE I take this drug?   If you are allergic to this drug; any part of this drug; or any other drugs, foods, or substances. Tell your doctor about the allergy and what signs you had.   This drug may interact with other drugs or health problems.   Tell your doctor and pharmacist about all of your drugs (prescription or OTC, natural products, vitamins) and health problems. You must check to make sure that it is safe for you to take this drug with all of your drugs and health problems. Do not start, stop, or change the dose of any drug without checking with your doctor.  What are some things I need to know or do while I take this drug?   Tell all of your health care providers that you take this drug. This includes your doctors, nurses, pharmacists, and dentists.   This drug will not treat a C diff infection. Keep taking your other antibiotic treatment for the C diff infection as you have been told by your doctor.   Tell your doctor if you are pregnant, plan on getting pregnant, or are breast-feeding. You will need to talk about the benefits and risks to you and the baby.  What are some side effects that I need to call my doctor about right away?   WARNING/CAUTION: Even though it may be rare, some people may have very bad and sometimes deadly side effects when taking a drug. Tell your doctor or get medical help right away if you have any of  the following signs or symptoms that may be related to a very bad side effect:   Signs of an allergic reaction, like rash; hives; itching; red, swollen, blistered, or peeling skin with or without fever; wheezing; tightness in the chest or throat; trouble breathing, swallowing, or talking; unusual hoarseness; or swelling of the mouth, face, lips, tongue, or throat.   Signs of high blood pressure like very bad headache or dizziness, passing out, or change in eyesight.   Serious heart failure has happened with this drug. People with a history of heart failure who took this drug had a higher chance of heart failure and death than those who did not take this drug. Tell your doctor if you have ever had heart failure. Call your doctor right away if you have shortness of breath, a big weight gain, or swelling in the arms or legs that is new or worse.  What are some other side effects of this drug?   All drugs may cause side effects. However, many people have no side effects or only have minor side effects. Call your doctor or get medical help if any of these side effects or any other side effects bother you or do not go away:   Upset stomach.   Fever.   Headache.   Feeling tired or weak.   Dizziness.   These are not all of the side effects that may occur. If you have  questions about side effects, call your doctor. Call your doctor for medical advice about side effects.   You may report side effects to your national health agency.  How is this drug best taken?   Use this drug as ordered by your doctor. Read all information given to you. Follow all instructions closely.   It is given as an infusion into a vein over a period of time.  What do I do if I miss a dose?   Call your doctor to find out what to do.  How do I store and/or throw out this drug?   If you need to store this drug at home, talk with your doctor, nurse, or pharmacist about how to store it.  General drug facts   If your symptoms or  health problems do not get better or if they become worse, call your doctor.   Do not share your drugs with others and do not take anyone else's drugs.   Keep all drugs in a safe place. Keep all drugs out of the reach of children and pets.   Throw away unused or expired drugs. Do not flush down a toilet or pour down a drain unless you are told to do so. Check with your pharmacist if you have questions about the best way to throw out drugs. There may be drug take-back programs in your area.   Some drugs may have another patient information leaflet. If you have any questions about this drug, please talk with your doctor, nurse, pharmacist, or other health care provider.   If you think there has been an overdose, call your poison control center or get medical care right away. Be ready to tell or show what was taken, how much, and when it happened.

## 2020-12-02 DIAGNOSIS — L400 Psoriasis vulgaris: Secondary | ICD-10-CM | POA: Diagnosis not present

## 2020-12-03 DIAGNOSIS — H6123 Impacted cerumen, bilateral: Secondary | ICD-10-CM | POA: Diagnosis not present

## 2020-12-07 ENCOUNTER — Telehealth: Payer: Self-pay

## 2020-12-07 NOTE — Telephone Encounter (Signed)
Received call from [facility] at [facility] requesting patient's last office note. RN requested that she fax a request to our office for records.   Beryle Flock, RN

## 2020-12-08 DIAGNOSIS — L400 Psoriasis vulgaris: Secondary | ICD-10-CM | POA: Diagnosis not present

## 2020-12-13 DIAGNOSIS — Z1231 Encounter for screening mammogram for malignant neoplasm of breast: Secondary | ICD-10-CM | POA: Diagnosis not present

## 2020-12-15 DIAGNOSIS — L400 Psoriasis vulgaris: Secondary | ICD-10-CM | POA: Diagnosis not present

## 2020-12-22 DIAGNOSIS — L400 Psoriasis vulgaris: Secondary | ICD-10-CM | POA: Diagnosis not present

## 2020-12-29 ENCOUNTER — Encounter: Payer: Self-pay | Admitting: Infectious Diseases

## 2020-12-29 ENCOUNTER — Other Ambulatory Visit: Payer: Self-pay

## 2020-12-29 ENCOUNTER — Ambulatory Visit (INDEPENDENT_AMBULATORY_CARE_PROVIDER_SITE_OTHER): Payer: Medicare Other | Admitting: Infectious Diseases

## 2020-12-29 VITALS — BP 134/83 | HR 74 | Temp 97.9°F | Ht 63.0 in | Wt 116.0 lb

## 2020-12-29 DIAGNOSIS — Z23 Encounter for immunization: Secondary | ICD-10-CM | POA: Diagnosis not present

## 2020-12-29 DIAGNOSIS — A0471 Enterocolitis due to Clostridium difficile, recurrent: Secondary | ICD-10-CM | POA: Diagnosis not present

## 2020-12-29 MED ORDER — VANCOMYCIN HCL 125 MG PO CAPS: 125.0000 mg | ORAL_CAPSULE | ORAL | 1 refills | Status: DC

## 2020-12-29 NOTE — Progress Notes (Signed)
[facility]                                                             [place], [place], [place], [address]                                                                  Phn. [phone]; Fax: [phone]                                                                             Date: 12/28/2020  Reason for Follow Up: Recurrent C diff Diarrhea  Assessment 1. Recurrent C diff diarrhea ( 2nd recurrence) 1st episode in Jan 2021 - PO Vancomycin for 10 days with prolonged taper for 6 weeks, diarrhea resolved 2nd episode in May 2021 - treated same as above with resolution of diarrhea 3rd episode in November 2021 - Completed 10 days of PO Vancomycin, currently on PO Vancomycin every alternate days started on 12/24/20  2. H/o diverticular perforation in August 2020  3. Health Maintenance  - discussed about getting a covid booster   Plan - Continue PO Vancomycin 125 mg PO every alternate days for 4 more weeks. Meds refilled  - Discussed about co-pays with Fidaxomicin and potential need to use it if she has another recurrence in the future - Discussed about doing an IV one dose of Bezlotuxumab while she is on treatment for C diff infection. She is not willing for that currently. She will decide by next appointment - Fu  In 4 weeks  All questions and concerns were discussed and addressed. Patient verbalized understanding of the plan. ____________________________________________________________________________________________________________________ Subjective/Interval events 12/29/2020 Denise Young is here for follow up for her recurrent C diff diarrhea. She has ben currently taking the PO Vancomycin every alternate days which she started taking last Friday ( 12/31). Her diarrhea resolved after being on Vancomycin for first 6-7 days. She feels good.  Denies having nausea/vomiting and abdominal  pain Appetite is good. Denies any changes in her weight.   She says she was taking Florastor every other day since 12/31 prescribed by Dr Kalman Shan and felt it was making her stomach sick. She has stopped taking it now and takes another OTC probiotic and does not feel sick anymore.   Discussed about Bezlotuxumab today, its indication, use and potential side effects. She is not ready to use because she read about " death" being one of the side effects. She says she might consider it in the future but not ready as of today.   ROS: 11 point ROS done with all pertinent positives and negatives listed above  Past Medical History:  Diagnosis Date  . Atrophic vaginitis 03/20/2013  . ELEVATED BP READING WITHOUT DX HYPERTENSION 10/04/2010  . FIBROMYALGIA  10/04/2010  . IBS 10/26/2010  . Osteopenia   . Osteoporosis    in the spine  . Other anxiety states 10/26/2010  . PALPITATIONS, RECURRENT 10/26/2010  . Perforated diverticulum 07/2019  . RHINITIS 11/23/2010  . SINUSITIS - ACUTE-NOS 11/28/2010  . SLEEP DISORDER/DISTURBANCE 10/04/2010  . THYROID NODULE 10/04/2010   Past Surgical History:  Procedure Laterality Date  . BREAST SURGERY  12/25/1990 and 12/25/1992   2 Done on Rt breast  . CESAREAN SECTION  12/26/1987  . DILATION AND CURETTAGE OF UTERUS  2004  . ESOPHAGOGASTRODUODENOSCOPY ENDOSCOPY  10/22/2019  . TEMPOROMANDIBULAR JOINT SURGERY  12/25/1993   Current Outpatient Medications on File Prior to Visit  Medication Sig Dispense Refill  . Cobalamin Combinations (B-12 + FOLIC ACID PO) Take by mouth.    . diazepam (VALIUM) 5 MG tablet Take 1 tablet by mouth daily as needed for anxiety.   0  . hydroxypropyl methylcellulose / hypromellose (ISOPTO TEARS / GONIOVISC) 2.5 % ophthalmic solution 1 drop.    . magnesium 30 MG tablet Take 30 mg by mouth 2 (two) times daily.    . mupirocin ointment (BACTROBAN) 2 % mupirocin 2 % topical ointment    . potassium chloride SA (KLOR-CON) 20 MEQ tablet Take 1  tablet (20 mEq total) by mouth 2 (two) times daily. 14 tablet 0  . propranolol (INDERAL) 10 MG tablet Take 10 mg by mouth daily.     Marland Kitchen saccharomyces boulardii (FLORASTOR) 250 MG capsule Take 250 mg by mouth in the morning, at noon, and at bedtime.    . vancomycin (VANCOCIN) 125 MG capsule Take 1 capsule (125 mg total) by mouth 4 (four) times daily. 40 capsule 0  . Vitamin D-Vitamin K (VITAMIN K2-VITAMIN D3 PO) Take by mouth.     No current facility-administered medications on file prior to visit.   Allergies  Allergen Reactions  . Augmentin [Amoxicillin-Pot Clavulanate] Nausea And Vomiting    Vomiting   . Nsaids Other (See Comments)    Uncoded Allergy. Allergen: steriods, Other Reaction: AGITATION & SLEEPLESNESS  . Adhesive [Tape]   . Ciprofloxacin     Other reaction(s): insomnia, palpitations  . Epinephrine     Other reaction(s): Heart race  . Erythromycin   . Flagyl [Metronidazole]     rash  . Fosamax [Alendronate]     Other reaction(s): Leg pains  . Ibuprofen     Other reaction(s): burns stomach  . Latex Other (See Comments)    Redness and irritation  . Levaquin [Levofloxacin In D5w]     Rapid heart beat  . Lisinopril   . Neosporin [Neomycin-Bacitracin Zn-Polymyx]   . Nickel   . Other     Steroid sensitive, pt feels agitated and can't sleep.  . Pseudoephedrine Hcl     Other reaction(s): palpitations  . Sulfamethoxazole-Trimethoprim     Other reaction(s): burns stomach    Vitals Ht 5\' 3"  (1.6 m)   Wt 116 lb (52.6 kg)   LMP 08/25/2004   BMI 20.55 kg/m    Examination  General - not in acute distress, comfortably sitting in chair HEENT - PEERLA, no pallor and no icterus Chest - b/l clear air entry, no additional sounds CVS- Normal s1s2, RRR Abdomen - Soft, Non tender , non distended Ext- no pedal edema Neuro: grossly non focal  Back - WNL Psych : calm and cooperative   Recent labs CBC Latest Ref Rng & Units 11/19/2020 01/09/2020 08/20/2019  WBC 4.0 - 10.5  K/uL 10.8(H) 10.2 5.7  Hemoglobin 12.0 - 15.0 g/dL 13.3 14.1 10.3(L)  Hematocrit 36.0 - 46.0 % 39.4 44.3 32.0(L)  Platelets 150 - 400 K/uL 149(L) 262 128(L)   CMP Latest Ref Rng & Units 11/19/2020 01/09/2020 08/20/2019  Glucose 70 - 99 mg/dL 108(H) 106(H) 106(H)  BUN 8 - 23 mg/dL 15 9 5(L)  Creatinine 0.44 - 1.00 mg/dL 0.92 0.78 0.79  Sodium 135 - 145 mmol/L 134(L) 141 137  Potassium 3.5 - 5.1 mmol/L 3.2(L) 3.5 3.6  Chloride 98 - 111 mmol/L 99 101 106  CO2 22 - 32 mmol/L 24 30 25   Calcium 8.9 - 10.3 mg/dL 8.8(L) 9.2 8.2(L)  Total Protein 6.5 - 8.1 g/dL 6.7 7.1 -  Total Bilirubin 0.3 - 1.2 mg/dL 1.2 1.0 -  Alkaline Phos 38 - 126 U/L 69 95 -  AST 15 - 41 U/L 27 27 -  ALT 0 - 44 U/L 17 20 -    Pertinent Microbiology Results for orders placed or performed during the hospital encounter of 11/19/20  C Difficile Quick Screen w PCR reflex     Status: Abnormal   Collection Time: 11/19/20  8:04 PM   Specimen: STOOL  Result Value Ref Range Status   C Diff antigen POSITIVE (A) NEGATIVE Final   C Diff toxin POSITIVE (A) NEGATIVE Final   C Diff interpretation Toxin producing C. difficile detected.  Final    Comment: RESULT CALLED TO, READ BACK BY AND VERIFIED WITH: L ADKINS RN 11/20/20 0000 JDW Performed at [facility], [address], [place], [place] [address]   Gastrointestinal Panel by PCR , Stool     Status: None   Collection Time: 11/19/20  8:04 PM   Specimen: STOOL  Result Value Ref Range Status   Campylobacter species NOT DETECTED NOT DETECTED Final   Plesimonas shigelloides NOT DETECTED NOT DETECTED Final   Salmonella species NOT DETECTED NOT DETECTED Final   Yersinia enterocolitica NOT DETECTED NOT DETECTED Final   Vibrio species NOT DETECTED NOT DETECTED Final   Vibrio cholerae NOT DETECTED NOT DETECTED Final   Enteroaggregative E coli (EAEC) NOT DETECTED NOT DETECTED Final   Enteropathogenic E coli (EPEC) NOT DETECTED NOT DETECTED Final   Enterotoxigenic E coli (ETEC)  NOT DETECTED NOT DETECTED Final   Shiga like toxin producing E coli (STEC) NOT DETECTED NOT DETECTED Final   Shigella/Enteroinvasive E coli (EIEC) NOT DETECTED NOT DETECTED Final   Cryptosporidium NOT DETECTED NOT DETECTED Final   Cyclospora cayetanensis NOT DETECTED NOT DETECTED Final   Entamoeba histolytica NOT DETECTED NOT DETECTED Final   Giardia lamblia NOT DETECTED NOT DETECTED Final   Adenovirus F40/41 NOT DETECTED NOT DETECTED Final   Astrovirus NOT DETECTED NOT DETECTED Final   Norovirus GI/GII NOT DETECTED NOT DETECTED Final   Rotavirus A NOT DETECTED NOT DETECTED Final   Sapovirus (I, II, IV, and V) NOT DETECTED NOT DETECTED Final    Comment: Performed at [facility], [address], [place], [place] [address]    All pertinent labs/Imagings/notes reviewed. All pertinent plain films and CT images have been personally visualized and interpreted; radiology reports have been reviewed. Decision making incorporated into the Impression / Recommendations.  I spent greater than 25 minutes with the patient including  review of prior medical records with greater than 50% of time in face to face counsel of the patient.    Electronically signed by:  Rosiland Oz, MD Infectious Disease Physician [facility] [address] [place], [place] [address] Phone: [phone]  Fax: [phone]

## 2020-12-29 NOTE — Assessment & Plan Note (Signed)
Continue alternate day PO Vancomycin Fu in 4 weeks Will discuss about Bezlotuxumab in next visit again

## 2020-12-29 NOTE — Assessment & Plan Note (Signed)
Discussed about COVID booster

## 2020-12-30 ENCOUNTER — Ambulatory Visit: Payer: Medicare Other | Admitting: Infectious Diseases

## 2020-12-30 DIAGNOSIS — A0472 Enterocolitis due to Clostridium difficile, not specified as recurrent: Secondary | ICD-10-CM | POA: Diagnosis not present

## 2021-01-03 DIAGNOSIS — A0471 Enterocolitis due to Clostridium difficile, recurrent: Secondary | ICD-10-CM | POA: Diagnosis not present

## 2021-01-03 DIAGNOSIS — L400 Psoriasis vulgaris: Secondary | ICD-10-CM | POA: Diagnosis not present

## 2021-01-03 DIAGNOSIS — R197 Diarrhea, unspecified: Secondary | ICD-10-CM | POA: Diagnosis not present

## 2021-01-03 DIAGNOSIS — R109 Unspecified abdominal pain: Secondary | ICD-10-CM | POA: Diagnosis not present

## 2021-01-17 DIAGNOSIS — L400 Psoriasis vulgaris: Secondary | ICD-10-CM | POA: Diagnosis not present

## 2021-01-19 ENCOUNTER — Ambulatory Visit (INDEPENDENT_AMBULATORY_CARE_PROVIDER_SITE_OTHER): Payer: Medicare Other | Admitting: Obstetrics & Gynecology

## 2021-01-19 ENCOUNTER — Encounter: Payer: Self-pay | Admitting: Obstetrics & Gynecology

## 2021-01-19 ENCOUNTER — Other Ambulatory Visit (HOSPITAL_COMMUNITY)
Admission: RE | Admit: 2021-01-19 | Discharge: 2021-01-19 | Disposition: A | Discharge status: Home / Self Care | Payer: Medicare Other | Source: Ambulatory Visit | Attending: Obstetrics & Gynecology | Admitting: Obstetrics & Gynecology

## 2021-01-19 ENCOUNTER — Other Ambulatory Visit: Payer: Self-pay

## 2021-01-19 VITALS — BP 131/74 | HR 69 | Ht 63.0 in | Wt 118.0 lb

## 2021-01-19 DIAGNOSIS — Z124 Encounter for screening for malignant neoplasm of cervix: Secondary | ICD-10-CM

## 2021-01-19 DIAGNOSIS — L409 Psoriasis, unspecified: Secondary | ICD-10-CM

## 2021-01-19 DIAGNOSIS — N952 Postmenopausal atrophic vaginitis: Secondary | ICD-10-CM | POA: Diagnosis not present

## 2021-01-19 DIAGNOSIS — A0471 Enterocolitis due to Clostridium difficile, recurrent: Secondary | ICD-10-CM | POA: Diagnosis not present

## 2021-01-19 DIAGNOSIS — M810 Age-related osteoporosis without current pathological fracture: Secondary | ICD-10-CM | POA: Diagnosis not present

## 2021-01-19 DIAGNOSIS — T50Z95S Adverse effect of other vaccines and biological substances, sequela: Secondary | ICD-10-CM

## 2021-01-19 DIAGNOSIS — Z01419 Encounter for gynecological examination (general) (routine) without abnormal findings: Secondary | ICD-10-CM

## 2021-01-19 DIAGNOSIS — I10 Essential (primary) hypertension: Secondary | ICD-10-CM

## 2021-01-19 NOTE — Progress Notes (Signed)
70 y.o. G1P1 Married White or Caucasian female here for breast and pelvic exam.  Pt desires to be seen yearly for exam.  Knows a 70 year old woman who was just diagnosed with cervical cancer.  Very nervous about this.  Requests pap smear today.  Guidelines reviewed with her and shared decision making occurred today regarding pap smears.  Denies vaginal bleeding.  She's c diff colitis three times in 2021--Jan, May, and then November.  She is seeing Dr. Cristina Gong.  If has again, may need to consider stool transplant.  D/w pt other pt's experiences with this treatment modality for c diff colitis treatment.  Pt had perforated duodenal ulcer from duodenal diverticulitis in 07/2019.  Was hospitalized then.  Lost a significant amount of weight.  She lost #14 pounds then but Is 5 pounds heavier now than 09/2019 when I saw her last.  Did receive first two Covid vaccines.  Hasn't gotten the third one.  Blood pressures went out of control.  Took 6 propranolol for a few days to control.  Felt she didn't have great guidance about what to do.  Pulse got down to 50 with the propranolol and this made her very worried.  Showed me a picture today of her granddaughter-- Dephne Ward Losey-- from Christmas.  Patient's last menstrual period was 08/25/2004.          Sexually active: Yes.    H/O STD:  no  Health Maintenance: PCP:  Dr. Jackelyn Poling.  Last wellness appt was 09/2020.  Did blood work at that appt:  yes Vaccines are up to date:  yes Colonoscopy:  05/03/2018 [facility]:  12/14/2020 BMD:  With Dr. Jackelyn Poling, stable per pt Last pap smear:  2019 neg.   H/o abnormal pap smear:  no    reports that she has quit smoking. She has never used smokeless tobacco. She reports that she does not drink alcohol and does not use drugs.  Past Medical History:  Diagnosis Date  . Atrophic vaginitis 03/20/2013  . ELEVATED BP READING WITHOUT DX HYPERTENSION 10/04/2010  . FIBROMYALGIA 10/04/2010  . IBS 10/26/2010  . Osteopenia   .  Osteoporosis    in the spine  . Other anxiety states 10/26/2010  . PALPITATIONS, RECURRENT 10/26/2010  . Perforated diverticulum 07/2019  . RHINITIS 11/23/2010  . SINUSITIS - ACUTE-NOS 11/28/2010  . SLEEP DISORDER/DISTURBANCE 10/04/2010  . THYROID NODULE 10/04/2010    Past Surgical History:  Procedure Laterality Date  . BREAST SURGERY  12/25/1990 and 12/25/1992   2 Done on Rt breast  . CESAREAN SECTION  12/26/1987  . DILATION AND CURETTAGE OF UTERUS  2004  . ESOPHAGOGASTRODUODENOSCOPY ENDOSCOPY  10/22/2019  . TEMPOROMANDIBULAR JOINT SURGERY  12/25/1993    Current Outpatient Medications  Medication Sig Dispense Refill  . acetaminophen (TYLENOL) 325 MG tablet 2 tablet as needed    . Azelaic Acid 15 % cream azelaic acid 15 % topical gel    . Cobalamin Combinations (I-94 + FOLIC ACID PO) Take by mouth.    . diazepam (VALIUM) 5 MG tablet Take 1 tablet by mouth daily as needed for anxiety.   0  . diphenhydrAMINE (BENADRYL) 12.5 MG/5ML liquid 10 ml as needed    . hydroxypropyl methylcellulose / hypromellose (ISOPTO TEARS / GONIOVISC) 2.5 % ophthalmic solution 1 drop.    . magnesium 30 MG tablet Take 30 mg by mouth 2 (two) times daily.    . mupirocin ointment (BACTROBAN) 2 % mupirocin 2 % topical ointment    .  potassium chloride SA (KLOR-CON) 20 MEQ tablet Take 1 tablet (20 mEq total) by mouth 2 (two) times daily. 14 tablet 0  . Probiotic Product (PROBIOTIC-10 PO) 1 capsule    . propranolol (INDERAL) 10 MG tablet Take 10 mg by mouth daily.     . vancomycin (VANCOCIN) 125 MG capsule Take 1 capsule (125 mg total) by mouth 4 (four) times daily. 40 capsule 0  . vancomycin (VANCOCIN) 125 MG capsule Take 1 capsule (125 mg total) by mouth every other day. 15 capsule 1  . Vitamin D-Vitamin K (VITAMIN K2-VITAMIN D3 PO) Take by mouth.    . Vitamin D-Vitamin K (VITAMIN K2-VITAMIN D3) 45-2000 MCG-UNIT CAPS 1 capsule    . saccharomyces boulardii (FLORASTOR) 250 MG capsule Take 250 mg by mouth in the  morning, at noon, and at bedtime. (Patient not taking: No sig reported)     No current facility-administered medications for this visit.    Family History  Problem Relation Age of Onset  . Heart disease Father   . Stroke Father   . Hypertension Mother        Mild  . Hypothyroidism Mother   . Migraines Sister   . Hypertension Sister   . Hypothyroidism Brother   . Cancer Brother        Cancer of neck  . Hypothyroidism Sister   . Hypertension Brother   . Diabetes Brother   . Heart disease Brother        Open Heart Disease  . Hyperlipidemia Brother   . Hypertension Brother   . Cancer Maternal Grandfather        pancreatic  . Osteoporosis Sister        hypercalciuria    Review of Systems  Constitutional: Negative.   Respiratory: Negative.   Cardiovascular: Negative.   Gastrointestinal: Negative.   Genitourinary: Negative.   Psychiatric/Behavioral: Negative.     Exam:   BP 131/74   Pulse 69   Ht 5\' 3"  (1.6 m)   Wt 118 lb (53.5 kg)   LMP 08/25/2004   BMI 20.90 kg/m   Height: 5\' 3"  (160 cm)  General appearance: alert, cooperative and appears stated age Breasts: normal appearance, no masses or tenderness Abdomen: soft, non-tender; bowel sounds normal; no masses,  no organomegaly Lymph nodes: Cervical, supraclavicular, and axillary nodes normal.  No abnormal inguinal nodes palpated Neurologic: Grossly normal  Pelvic: External genitalia:  no lesions              Urethra:  normal appearing urethra with no masses, tenderness or lesions              Bartholins and Skenes: normal                 Vagina: normal appearing vagina with normal color and discharge, no lesions              Cervix: no lesions              Pap taken: Yes.   Bimanual Exam:  Uterus:  normal size, contour, position, consistency, mobility, non-tender              Adnexa: normal adnexa and no mass, fullness, tenderness               Rectovaginal: Confirms               Anus:  normal sphincter tone,  no lesions  Chaperone, Sherryle Lis, RN, was present for exam.  Assessment/Plan: 1.  Encounter for gynecological examination without abnormal finding - - Cytology - PAP( [facility]) - PR OBTAINING SCREEN PAP SMEAR - [facility] 12/14/20 - Colonoscopy 05/03/2018  - BMD done with Dr. Jackelyn Poling and stable per pt - lab work done in 2021 - vaccines are not up to date--pneumovax and third covid have not been completed.  Concerned about side effects esp BP.  Advised to discuss plan for management of BP with Dr. Jackelyn Poling prior to administration of third Covid.  Do feel it is important for her to get.  2. Atrophic vaginitis - not treatment needd at this time  3. Adverse effect of vaccine, sequela - see above  4. Recurrent Clostridioides difficile diarrhea - followed by Dr. Cristina Gong   5. Psoriasis - followed by dermatology  6. Osteoporosis without current pathological fracture, unspecified osteoporosis type - followed by Dr. Jackelyn Poling  7. Essential hypertension - followed by Dr. Jackelyn Poling   22 minutes of total time was spent for this patient encounter, including preparation, face-to-face counseling with the patient and coordination of care, and documentation of the encounter.

## 2021-01-20 LAB — CYTOLOGY - PAP: Diagnosis: NEGATIVE

## 2021-01-21 ENCOUNTER — Ambulatory Visit: Payer: Medicare Other

## 2021-01-26 DIAGNOSIS — L400 Psoriasis vulgaris: Secondary | ICD-10-CM | POA: Diagnosis not present

## 2021-02-01 DIAGNOSIS — L400 Psoriasis vulgaris: Secondary | ICD-10-CM | POA: Diagnosis not present

## 2021-02-03 DIAGNOSIS — L400 Psoriasis vulgaris: Secondary | ICD-10-CM | POA: Diagnosis not present

## 2021-02-07 DIAGNOSIS — A0472 Enterocolitis due to Clostridium difficile, not specified as recurrent: Secondary | ICD-10-CM | POA: Diagnosis not present

## 2021-02-08 DIAGNOSIS — L400 Psoriasis vulgaris: Secondary | ICD-10-CM | POA: Diagnosis not present

## 2021-02-09 DIAGNOSIS — L409 Psoriasis, unspecified: Secondary | ICD-10-CM | POA: Diagnosis not present

## 2021-02-09 DIAGNOSIS — R895 Abnormal microbiological findings in specimens from other organs, systems and tissues: Secondary | ICD-10-CM | POA: Diagnosis not present

## 2021-02-09 DIAGNOSIS — K639 Disease of intestine, unspecified: Secondary | ICD-10-CM | POA: Diagnosis not present

## 2021-02-09 DIAGNOSIS — A0472 Enterocolitis due to Clostridium difficile, not specified as recurrent: Secondary | ICD-10-CM | POA: Diagnosis not present

## 2021-02-10 DIAGNOSIS — M5416 Radiculopathy, lumbar region: Secondary | ICD-10-CM | POA: Diagnosis not present

## 2021-02-10 DIAGNOSIS — M25552 Pain in left hip: Secondary | ICD-10-CM | POA: Diagnosis not present

## 2021-02-18 DIAGNOSIS — L400 Psoriasis vulgaris: Secondary | ICD-10-CM | POA: Diagnosis not present

## 2021-02-21 DIAGNOSIS — H40013 Open angle with borderline findings, low risk, bilateral: Secondary | ICD-10-CM | POA: Diagnosis not present

## 2021-02-23 DIAGNOSIS — L400 Psoriasis vulgaris: Secondary | ICD-10-CM | POA: Diagnosis not present

## 2021-02-24 DIAGNOSIS — H6123 Impacted cerumen, bilateral: Secondary | ICD-10-CM | POA: Diagnosis not present

## 2021-02-28 DIAGNOSIS — M797 Fibromyalgia: Secondary | ICD-10-CM | POA: Diagnosis not present

## 2021-02-28 DIAGNOSIS — I10 Essential (primary) hypertension: Secondary | ICD-10-CM | POA: Diagnosis not present

## 2021-02-28 DIAGNOSIS — Z8619 Personal history of other infectious and parasitic diseases: Secondary | ICD-10-CM | POA: Diagnosis not present

## 2021-02-28 DIAGNOSIS — Z7185 Encounter for immunization safety counseling: Secondary | ICD-10-CM | POA: Diagnosis not present

## 2021-03-07 DIAGNOSIS — L400 Psoriasis vulgaris: Secondary | ICD-10-CM | POA: Diagnosis not present

## 2021-03-08 DIAGNOSIS — R5383 Other fatigue: Secondary | ICD-10-CM | POA: Diagnosis not present

## 2021-03-08 DIAGNOSIS — F419 Anxiety disorder, unspecified: Secondary | ICD-10-CM | POA: Diagnosis not present

## 2021-03-08 DIAGNOSIS — G4700 Insomnia, unspecified: Secondary | ICD-10-CM | POA: Diagnosis not present

## 2021-03-08 DIAGNOSIS — I10 Essential (primary) hypertension: Secondary | ICD-10-CM | POA: Diagnosis not present

## 2021-03-08 DIAGNOSIS — K2900 Acute gastritis without bleeding: Secondary | ICD-10-CM | POA: Diagnosis not present

## 2021-03-08 DIAGNOSIS — M859 Disorder of bone density and structure, unspecified: Secondary | ICD-10-CM | POA: Diagnosis not present

## 2021-03-14 DIAGNOSIS — L400 Psoriasis vulgaris: Secondary | ICD-10-CM | POA: Diagnosis not present

## 2021-03-21 DIAGNOSIS — L400 Psoriasis vulgaris: Secondary | ICD-10-CM | POA: Diagnosis not present

## 2021-03-22 DIAGNOSIS — K6389 Other specified diseases of intestine: Secondary | ICD-10-CM | POA: Diagnosis not present

## 2021-03-22 DIAGNOSIS — M797 Fibromyalgia: Secondary | ICD-10-CM | POA: Diagnosis not present

## 2021-03-28 DIAGNOSIS — L400 Psoriasis vulgaris: Secondary | ICD-10-CM | POA: Diagnosis not present

## 2021-03-30 DIAGNOSIS — U071 COVID-19: Secondary | ICD-10-CM | POA: Diagnosis not present

## 2021-03-31 DIAGNOSIS — Z20822 Contact with and (suspected) exposure to covid-19: Secondary | ICD-10-CM | POA: Diagnosis not present

## 2021-04-04 DIAGNOSIS — L400 Psoriasis vulgaris: Secondary | ICD-10-CM | POA: Diagnosis not present

## 2021-04-06 DIAGNOSIS — M5030 Other cervical disc degeneration, unspecified cervical region: Secondary | ICD-10-CM | POA: Diagnosis not present

## 2021-04-06 DIAGNOSIS — M7581 Other shoulder lesions, right shoulder: Secondary | ICD-10-CM | POA: Diagnosis not present

## 2021-04-12 DIAGNOSIS — L400 Psoriasis vulgaris: Secondary | ICD-10-CM | POA: Diagnosis not present

## 2021-04-19 DIAGNOSIS — L400 Psoriasis vulgaris: Secondary | ICD-10-CM | POA: Diagnosis not present

## 2021-04-20 DIAGNOSIS — E559 Vitamin D deficiency, unspecified: Secondary | ICD-10-CM | POA: Diagnosis not present

## 2021-04-20 DIAGNOSIS — E785 Hyperlipidemia, unspecified: Secondary | ICD-10-CM | POA: Diagnosis not present

## 2021-04-20 DIAGNOSIS — E041 Nontoxic single thyroid nodule: Secondary | ICD-10-CM | POA: Diagnosis not present

## 2021-04-25 DIAGNOSIS — R5383 Other fatigue: Secondary | ICD-10-CM | POA: Diagnosis not present

## 2021-04-25 DIAGNOSIS — L400 Psoriasis vulgaris: Secondary | ICD-10-CM | POA: Diagnosis not present

## 2021-04-25 DIAGNOSIS — R82998 Other abnormal findings in urine: Secondary | ICD-10-CM | POA: Diagnosis not present

## 2021-04-26 DIAGNOSIS — Z1212 Encounter for screening for malignant neoplasm of rectum: Secondary | ICD-10-CM | POA: Diagnosis not present

## 2021-04-27 DIAGNOSIS — L409 Psoriasis, unspecified: Secondary | ICD-10-CM | POA: Diagnosis not present

## 2021-04-27 DIAGNOSIS — Z0000 Encounter for general adult medical examination without abnormal findings: Secondary | ICD-10-CM | POA: Diagnosis not present

## 2021-04-27 DIAGNOSIS — Z1389 Encounter for screening for other disorder: Secondary | ICD-10-CM | POA: Diagnosis not present

## 2021-04-27 DIAGNOSIS — G4700 Insomnia, unspecified: Secondary | ICD-10-CM | POA: Diagnosis not present

## 2021-04-27 DIAGNOSIS — E041 Nontoxic single thyroid nodule: Secondary | ICD-10-CM | POA: Diagnosis not present

## 2021-04-27 DIAGNOSIS — Z1331 Encounter for screening for depression: Secondary | ICD-10-CM | POA: Diagnosis not present

## 2021-04-27 DIAGNOSIS — K219 Gastro-esophageal reflux disease without esophagitis: Secondary | ICD-10-CM | POA: Diagnosis not present

## 2021-04-27 DIAGNOSIS — E785 Hyperlipidemia, unspecified: Secondary | ICD-10-CM | POA: Diagnosis not present

## 2021-04-27 DIAGNOSIS — I10 Essential (primary) hypertension: Secondary | ICD-10-CM | POA: Diagnosis not present

## 2021-04-27 DIAGNOSIS — M810 Age-related osteoporosis without current pathological fracture: Secondary | ICD-10-CM | POA: Diagnosis not present

## 2021-04-27 DIAGNOSIS — M797 Fibromyalgia: Secondary | ICD-10-CM | POA: Diagnosis not present

## 2021-04-30 DIAGNOSIS — Z20822 Contact with and (suspected) exposure to covid-19: Secondary | ICD-10-CM | POA: Diagnosis not present

## 2021-05-03 DIAGNOSIS — L400 Psoriasis vulgaris: Secondary | ICD-10-CM | POA: Diagnosis not present

## 2021-05-06 DIAGNOSIS — Z23 Encounter for immunization: Secondary | ICD-10-CM | POA: Diagnosis not present

## 2021-05-16 DIAGNOSIS — L400 Psoriasis vulgaris: Secondary | ICD-10-CM | POA: Diagnosis not present

## 2021-05-24 DIAGNOSIS — D485 Neoplasm of uncertain behavior of skin: Secondary | ICD-10-CM | POA: Diagnosis not present

## 2021-05-24 DIAGNOSIS — L820 Inflamed seborrheic keratosis: Secondary | ICD-10-CM | POA: Diagnosis not present

## 2021-05-24 DIAGNOSIS — L111 Transient acantholytic dermatosis [Grover]: Secondary | ICD-10-CM | POA: Diagnosis not present

## 2021-05-24 DIAGNOSIS — L400 Psoriasis vulgaris: Secondary | ICD-10-CM | POA: Diagnosis not present

## 2021-05-24 DIAGNOSIS — L309 Dermatitis, unspecified: Secondary | ICD-10-CM | POA: Diagnosis not present

## 2021-05-27 DIAGNOSIS — H6123 Impacted cerumen, bilateral: Secondary | ICD-10-CM | POA: Diagnosis not present

## 2021-05-30 DIAGNOSIS — Z20822 Contact with and (suspected) exposure to covid-19: Secondary | ICD-10-CM | POA: Diagnosis not present

## 2021-06-03 DIAGNOSIS — L400 Psoriasis vulgaris: Secondary | ICD-10-CM | POA: Diagnosis not present

## 2021-06-08 DIAGNOSIS — L400 Psoriasis vulgaris: Secondary | ICD-10-CM | POA: Diagnosis not present

## 2021-06-16 DIAGNOSIS — L400 Psoriasis vulgaris: Secondary | ICD-10-CM | POA: Diagnosis not present

## 2021-07-01 DIAGNOSIS — Z20822 Contact with and (suspected) exposure to covid-19: Secondary | ICD-10-CM | POA: Diagnosis not present

## 2021-07-07 DIAGNOSIS — L400 Psoriasis vulgaris: Secondary | ICD-10-CM | POA: Diagnosis not present

## 2021-07-13 DIAGNOSIS — L400 Psoriasis vulgaris: Secondary | ICD-10-CM | POA: Diagnosis not present

## 2021-07-20 DIAGNOSIS — L400 Psoriasis vulgaris: Secondary | ICD-10-CM | POA: Diagnosis not present

## 2021-07-27 DIAGNOSIS — L400 Psoriasis vulgaris: Secondary | ICD-10-CM | POA: Diagnosis not present

## 2021-08-02 DIAGNOSIS — Z20822 Contact with and (suspected) exposure to covid-19: Secondary | ICD-10-CM | POA: Diagnosis not present

## 2021-08-04 DIAGNOSIS — L400 Psoriasis vulgaris: Secondary | ICD-10-CM | POA: Diagnosis not present

## 2021-08-11 DIAGNOSIS — L400 Psoriasis vulgaris: Secondary | ICD-10-CM | POA: Diagnosis not present

## 2021-08-24 DIAGNOSIS — L400 Psoriasis vulgaris: Secondary | ICD-10-CM | POA: Diagnosis not present

## 2021-08-30 DIAGNOSIS — L400 Psoriasis vulgaris: Secondary | ICD-10-CM | POA: Diagnosis not present

## 2021-09-02 DIAGNOSIS — Z20822 Contact with and (suspected) exposure to covid-19: Secondary | ICD-10-CM | POA: Diagnosis not present

## 2021-09-05 DIAGNOSIS — K5712 Diverticulitis of small intestine without perforation or abscess without bleeding: Secondary | ICD-10-CM | POA: Diagnosis not present

## 2021-09-05 DIAGNOSIS — I10 Essential (primary) hypertension: Secondary | ICD-10-CM | POA: Diagnosis not present

## 2021-09-05 DIAGNOSIS — L409 Psoriasis, unspecified: Secondary | ICD-10-CM | POA: Diagnosis not present

## 2021-09-05 DIAGNOSIS — M797 Fibromyalgia: Secondary | ICD-10-CM | POA: Diagnosis not present

## 2021-09-05 DIAGNOSIS — Z7185 Encounter for immunization safety counseling: Secondary | ICD-10-CM | POA: Diagnosis not present

## 2021-09-05 DIAGNOSIS — L709 Acne, unspecified: Secondary | ICD-10-CM | POA: Diagnosis not present

## 2021-09-05 DIAGNOSIS — A0471 Enterocolitis due to Clostridium difficile, recurrent: Secondary | ICD-10-CM | POA: Diagnosis not present

## 2021-09-05 DIAGNOSIS — E785 Hyperlipidemia, unspecified: Secondary | ICD-10-CM | POA: Diagnosis not present

## 2021-09-05 DIAGNOSIS — M810 Age-related osteoporosis without current pathological fracture: Secondary | ICD-10-CM | POA: Diagnosis not present

## 2021-09-05 DIAGNOSIS — G4709 Other insomnia: Secondary | ICD-10-CM | POA: Diagnosis not present

## 2021-09-05 DIAGNOSIS — E041 Nontoxic single thyroid nodule: Secondary | ICD-10-CM | POA: Diagnosis not present

## 2021-09-06 DIAGNOSIS — H6123 Impacted cerumen, bilateral: Secondary | ICD-10-CM | POA: Diagnosis not present

## 2021-09-06 DIAGNOSIS — L400 Psoriasis vulgaris: Secondary | ICD-10-CM | POA: Diagnosis not present

## 2021-09-14 DIAGNOSIS — L400 Psoriasis vulgaris: Secondary | ICD-10-CM | POA: Diagnosis not present

## 2021-09-21 DIAGNOSIS — L400 Psoriasis vulgaris: Secondary | ICD-10-CM | POA: Diagnosis not present

## 2021-09-30 DIAGNOSIS — Z20822 Contact with and (suspected) exposure to covid-19: Secondary | ICD-10-CM | POA: Diagnosis not present

## 2021-09-30 DIAGNOSIS — Z23 Encounter for immunization: Secondary | ICD-10-CM | POA: Diagnosis not present

## 2021-10-06 DIAGNOSIS — L400 Psoriasis vulgaris: Secondary | ICD-10-CM | POA: Diagnosis not present

## 2021-10-12 DIAGNOSIS — L400 Psoriasis vulgaris: Secondary | ICD-10-CM | POA: Diagnosis not present

## 2021-10-19 DIAGNOSIS — L718 Other rosacea: Secondary | ICD-10-CM | POA: Diagnosis not present

## 2021-10-19 DIAGNOSIS — L400 Psoriasis vulgaris: Secondary | ICD-10-CM | POA: Diagnosis not present

## 2021-10-19 DIAGNOSIS — L111 Transient acantholytic dermatosis [Grover]: Secondary | ICD-10-CM | POA: Diagnosis not present

## 2021-10-19 DIAGNOSIS — L821 Other seborrheic keratosis: Secondary | ICD-10-CM | POA: Diagnosis not present

## 2021-10-25 DIAGNOSIS — L400 Psoriasis vulgaris: Secondary | ICD-10-CM | POA: Diagnosis not present

## 2021-11-01 DIAGNOSIS — L400 Psoriasis vulgaris: Secondary | ICD-10-CM | POA: Diagnosis not present

## 2021-11-01 DIAGNOSIS — L409 Psoriasis, unspecified: Secondary | ICD-10-CM | POA: Diagnosis not present

## 2021-11-01 DIAGNOSIS — G4700 Insomnia, unspecified: Secondary | ICD-10-CM | POA: Diagnosis not present

## 2021-11-01 DIAGNOSIS — Z20822 Contact with and (suspected) exposure to covid-19: Secondary | ICD-10-CM | POA: Diagnosis not present

## 2021-11-01 DIAGNOSIS — E041 Nontoxic single thyroid nodule: Secondary | ICD-10-CM | POA: Diagnosis not present

## 2021-11-01 DIAGNOSIS — M810 Age-related osteoporosis without current pathological fracture: Secondary | ICD-10-CM | POA: Diagnosis not present

## 2021-11-01 DIAGNOSIS — K219 Gastro-esophageal reflux disease without esophagitis: Secondary | ICD-10-CM | POA: Diagnosis not present

## 2021-11-01 DIAGNOSIS — I10 Essential (primary) hypertension: Secondary | ICD-10-CM | POA: Diagnosis not present

## 2021-11-01 DIAGNOSIS — M797 Fibromyalgia: Secondary | ICD-10-CM | POA: Diagnosis not present

## 2021-11-01 DIAGNOSIS — E785 Hyperlipidemia, unspecified: Secondary | ICD-10-CM | POA: Diagnosis not present

## 2021-11-09 DIAGNOSIS — L400 Psoriasis vulgaris: Secondary | ICD-10-CM | POA: Diagnosis not present

## 2021-11-14 DIAGNOSIS — L400 Psoriasis vulgaris: Secondary | ICD-10-CM | POA: Diagnosis not present

## 2021-11-16 DIAGNOSIS — L400 Psoriasis vulgaris: Secondary | ICD-10-CM | POA: Diagnosis not present

## 2021-11-21 DIAGNOSIS — L400 Psoriasis vulgaris: Secondary | ICD-10-CM | POA: Diagnosis not present

## 2021-11-28 DIAGNOSIS — L400 Psoriasis vulgaris: Secondary | ICD-10-CM | POA: Diagnosis not present

## 2021-11-29 ENCOUNTER — Other Ambulatory Visit (HOSPITAL_COMMUNITY): Payer: Self-pay | Admitting: Family Medicine

## 2021-11-29 DIAGNOSIS — H6123 Impacted cerumen, bilateral: Secondary | ICD-10-CM | POA: Diagnosis not present

## 2021-11-29 DIAGNOSIS — I10 Essential (primary) hypertension: Secondary | ICD-10-CM | POA: Diagnosis not present

## 2021-11-29 DIAGNOSIS — R5383 Other fatigue: Secondary | ICD-10-CM

## 2021-11-29 DIAGNOSIS — F419 Anxiety disorder, unspecified: Secondary | ICD-10-CM | POA: Diagnosis not present

## 2021-11-29 DIAGNOSIS — E559 Vitamin D deficiency, unspecified: Secondary | ICD-10-CM | POA: Diagnosis not present

## 2021-11-29 DIAGNOSIS — E041 Nontoxic single thyroid nodule: Secondary | ICD-10-CM | POA: Diagnosis not present

## 2021-11-29 DIAGNOSIS — E785 Hyperlipidemia, unspecified: Secondary | ICD-10-CM | POA: Diagnosis not present

## 2021-11-29 DIAGNOSIS — M797 Fibromyalgia: Secondary | ICD-10-CM | POA: Diagnosis not present

## 2021-11-29 DIAGNOSIS — L409 Psoriasis, unspecified: Secondary | ICD-10-CM | POA: Diagnosis not present

## 2021-11-29 DIAGNOSIS — R7989 Other specified abnormal findings of blood chemistry: Secondary | ICD-10-CM | POA: Diagnosis not present

## 2021-12-01 DIAGNOSIS — L400 Psoriasis vulgaris: Secondary | ICD-10-CM | POA: Diagnosis not present

## 2021-12-04 DIAGNOSIS — Z20822 Contact with and (suspected) exposure to covid-19: Secondary | ICD-10-CM | POA: Diagnosis not present

## 2021-12-07 DIAGNOSIS — L400 Psoriasis vulgaris: Secondary | ICD-10-CM | POA: Diagnosis not present

## 2021-12-09 ENCOUNTER — Other Ambulatory Visit (HOSPITAL_COMMUNITY): Payer: Self-pay | Admitting: Family Medicine

## 2021-12-09 DIAGNOSIS — R002 Palpitations: Secondary | ICD-10-CM

## 2021-12-09 DIAGNOSIS — R0602 Shortness of breath: Secondary | ICD-10-CM

## 2021-12-09 DIAGNOSIS — I10 Essential (primary) hypertension: Secondary | ICD-10-CM

## 2021-12-09 DIAGNOSIS — R5383 Other fatigue: Secondary | ICD-10-CM

## 2021-12-12 DIAGNOSIS — L400 Psoriasis vulgaris: Secondary | ICD-10-CM | POA: Diagnosis not present

## 2021-12-14 ENCOUNTER — Ambulatory Visit (HOSPITAL_COMMUNITY)
Admission: RE | Admit: 2021-12-14 | Discharge: 2021-12-14 | Disposition: A | Discharge status: Home / Self Care | Payer: Medicare Other | Source: Ambulatory Visit | Attending: Family Medicine | Admitting: Family Medicine

## 2021-12-14 ENCOUNTER — Other Ambulatory Visit: Payer: Self-pay

## 2021-12-14 DIAGNOSIS — I351 Nonrheumatic aortic (valve) insufficiency: Secondary | ICD-10-CM | POA: Insufficient documentation

## 2021-12-14 DIAGNOSIS — R002 Palpitations: Secondary | ICD-10-CM

## 2021-12-14 DIAGNOSIS — I10 Essential (primary) hypertension: Secondary | ICD-10-CM | POA: Diagnosis not present

## 2021-12-14 DIAGNOSIS — R0602 Shortness of breath: Secondary | ICD-10-CM | POA: Diagnosis not present

## 2021-12-14 DIAGNOSIS — I071 Rheumatic tricuspid insufficiency: Secondary | ICD-10-CM | POA: Insufficient documentation

## 2021-12-14 LAB — ECHOCARDIOGRAM COMPLETE
Area-P 1/2: 3.26 cm2
S' Lateral: 2.8 cm

## 2021-12-14 NOTE — Progress Notes (Signed)
°  Echocardiogram 2D Echocardiogram has been performed.  Darlina Sicilian M 12/14/2021, 10:39 AM

## 2021-12-20 DIAGNOSIS — L400 Psoriasis vulgaris: Secondary | ICD-10-CM | POA: Diagnosis not present

## 2021-12-29 DIAGNOSIS — Z1231 Encounter for screening mammogram for malignant neoplasm of breast: Secondary | ICD-10-CM | POA: Diagnosis not present

## 2021-12-30 ENCOUNTER — Encounter (HOSPITAL_BASED_OUTPATIENT_CLINIC_OR_DEPARTMENT_OTHER): Payer: Self-pay | Admitting: *Deleted

## 2022-01-02 DIAGNOSIS — L400 Psoriasis vulgaris: Secondary | ICD-10-CM | POA: Diagnosis not present

## 2022-01-03 DIAGNOSIS — Z20822 Contact with and (suspected) exposure to covid-19: Secondary | ICD-10-CM | POA: Diagnosis not present

## 2022-01-05 DIAGNOSIS — L400 Psoriasis vulgaris: Secondary | ICD-10-CM | POA: Diagnosis not present

## 2022-01-10 DIAGNOSIS — L400 Psoriasis vulgaris: Secondary | ICD-10-CM | POA: Diagnosis not present

## 2022-01-16 DIAGNOSIS — L400 Psoriasis vulgaris: Secondary | ICD-10-CM | POA: Diagnosis not present

## 2022-01-19 DIAGNOSIS — L400 Psoriasis vulgaris: Secondary | ICD-10-CM | POA: Diagnosis not present

## 2022-01-20 ENCOUNTER — Ambulatory Visit (HOSPITAL_BASED_OUTPATIENT_CLINIC_OR_DEPARTMENT_OTHER): Payer: Medicare Other | Admitting: Obstetrics & Gynecology

## 2022-01-23 ENCOUNTER — Other Ambulatory Visit: Payer: Self-pay

## 2022-01-23 ENCOUNTER — Encounter (HOSPITAL_BASED_OUTPATIENT_CLINIC_OR_DEPARTMENT_OTHER): Payer: Self-pay | Admitting: Obstetrics & Gynecology

## 2022-01-23 ENCOUNTER — Ambulatory Visit (INDEPENDENT_AMBULATORY_CARE_PROVIDER_SITE_OTHER): Payer: Medicare Other | Admitting: Obstetrics & Gynecology

## 2022-01-23 VITALS — BP 162/77 | HR 79 | Ht 64.0 in | Wt 121.0 lb

## 2022-01-23 DIAGNOSIS — I10 Essential (primary) hypertension: Secondary | ICD-10-CM | POA: Diagnosis not present

## 2022-01-23 DIAGNOSIS — Z01419 Encounter for gynecological examination (general) (routine) without abnormal findings: Secondary | ICD-10-CM | POA: Diagnosis not present

## 2022-01-23 DIAGNOSIS — E041 Nontoxic single thyroid nodule: Secondary | ICD-10-CM | POA: Diagnosis not present

## 2022-01-23 DIAGNOSIS — L409 Psoriasis, unspecified: Secondary | ICD-10-CM

## 2022-01-23 DIAGNOSIS — R7989 Other specified abnormal findings of blood chemistry: Secondary | ICD-10-CM | POA: Diagnosis not present

## 2022-01-23 DIAGNOSIS — Z8719 Personal history of other diseases of the digestive system: Secondary | ICD-10-CM | POA: Diagnosis not present

## 2022-01-23 DIAGNOSIS — Z7185 Encounter for immunization safety counseling: Secondary | ICD-10-CM | POA: Diagnosis not present

## 2022-01-23 DIAGNOSIS — L709 Acne, unspecified: Secondary | ICD-10-CM | POA: Diagnosis not present

## 2022-01-23 DIAGNOSIS — Z8619 Personal history of other infectious and parasitic diseases: Secondary | ICD-10-CM | POA: Diagnosis not present

## 2022-01-23 DIAGNOSIS — M810 Age-related osteoporosis without current pathological fracture: Secondary | ICD-10-CM | POA: Diagnosis not present

## 2022-01-23 DIAGNOSIS — M797 Fibromyalgia: Secondary | ICD-10-CM | POA: Diagnosis not present

## 2022-01-23 NOTE — Progress Notes (Signed)
71 y.o. G1P1 Married White or Caucasian female here for breast and pelvic exam.  Pt desires yearly exams.  Has some urinary urgency.  Does not have urinary incontinence.  Denies vaginal bleeding.  Blood pressure is elevated today.  She is checking blood pressure at home after appointments because it has been elevated with the last several doctor appointments.  BPs at home are all normal.    Patient's last menstrual period was 08/25/2004.          Sexually active: Yes.    H/O STD:  no  Health Maintenance: PCP:  Dr. Jackelyn Young.  Last wellness appt was 10/2021.  Did blood work at that appt:  11/2021 Vaccines are up to date:  has not done shingrix Colonoscopy:  05/03/2018, follow up 5 years MMG:  12/29/2021 Negative BMD:  03/18/2018, pt reports this was done 2021 Last pap smear:  01/19/2021 Negative.   H/o abnormal pap smear:  no    reports that she has quit smoking. She has never used smokeless tobacco. She reports that she does not drink alcohol and does not use drugs.  Past Medical History:  Diagnosis Date   Atrophic vaginitis 03/20/2013   ELEVATED BP READING WITHOUT DX HYPERTENSION 10/04/2010   FIBROMYALGIA 10/04/2010   IBS 10/26/2010   Osteopenia    Osteoporosis    in the spine   Other anxiety states 10/26/2010   PALPITATIONS, RECURRENT 10/26/2010   Perforated diverticulum 07/2019   RHINITIS 11/23/2010   SINUSITIS - ACUTE-NOS 11/28/2010   SLEEP DISORDER/DISTURBANCE 10/04/2010   THYROID NODULE 10/04/2010    Past Surgical History:  Procedure Laterality Date   BREAST SURGERY  12/25/1990 and 12/25/1992   2 Done on Rt breast   CESAREAN SECTION  12/26/1987   DILATION AND CURETTAGE OF UTERUS  2004   ESOPHAGOGASTRODUODENOSCOPY ENDOSCOPY  10/22/2019   TEMPOROMANDIBULAR JOINT SURGERY  12/25/1993    Current Outpatient Medications  Medication Sig Dispense Refill   acetaminophen (TYLENOL) 325 MG tablet 2 tablet as needed     Cobalamin Combinations (A-21 + FOLIC ACID PO) Take by mouth.      diazepam (VALIUM) 5 MG tablet Take 1 tablet by mouth daily as needed for anxiety.   0   diphenhydrAMINE (BENADRYL) 12.5 MG/5ML liquid 10 ml as needed     hydroxypropyl methylcellulose / hypromellose (ISOPTO TEARS / GONIOVISC) 2.5 % ophthalmic solution 1 drop.     mupirocin ointment (BACTROBAN) 2 % mupirocin 2 % topical ointment     Probiotic Product (PROBIOTIC-10 PO) 1 capsule     propranolol (INDERAL) 10 MG tablet Take 10 mg by mouth daily.      Vitamin D-Vitamin K (VITAMIN K2-VITAMIN D3) 45-2000 MCG-UNIT CAPS 1 capsule     No current facility-administered medications for this visit.    Family History  Problem Relation Age of Onset   Heart disease Father    Stroke Father    Hypertension Mother        Mild   Hypothyroidism Mother    Migraines Sister    Hypertension Sister    Hypothyroidism Brother    Cancer Brother        Cancer of neck   Hypothyroidism Sister    Hypertension Brother    Diabetes Brother    Heart disease Brother        Open Heart Disease   Hyperlipidemia Brother    Hypertension Brother    Cancer Maternal Grandfather        pancreatic   Osteoporosis Sister  hypercalciuria    Review of Systems  Exam:   BP (!) 162/77 (BP Location: Left Arm, Patient Position: Sitting, Cuff Size: Normal)    Pulse 79    Ht 5\' 4"  (1.626 m) Comment: reported   Wt 121 lb (54.9 kg)    LMP 08/25/2004    BMI 20.77 kg/m   Height: 5\' 4"  (162.6 cm) (reported)  General appearance: alert, cooperative and appears stated age Breasts: normal appearance, no masses or tenderness Abdomen: soft, non-tender; bowel sounds normal; no masses,  no organomegaly Lymph nodes: Cervical, supraclavicular, and axillary nodes normal.  No abnormal inguinal nodes palpated Neurologic: Grossly normal  Pelvic: External genitalia:  no lesions              Urethra:  normal appearing urethra with no masses, tenderness or lesions              Bartholins and Skenes: normal                 Vagina:  normal appearing vagina with atrophic changes and no discharge, no lesions              Cervix: no lesions              Pap taken: No. Bimanual Exam:  Uterus:  normal size, contour, position, consistency, mobility, non-tender              Adnexa: normal adnexa and no mass, fullness, tenderness               Rectovaginal: Confirms               Anus:  normal sphincter tone, no lesions  Chaperone, [name], CMA, was present for exam.  Assessment/Plan: 1. Encntr for gyn exam (general) (routine) w/o abn findings - pap was neg 2022.  Not indicated today. - MMG 12/2021 - colonoscopy 2019.  Follow up 5 years. - BMD done 2021 per pt.  She will send a copy of this. - vaccines reviewed/updated - lab work done with Dr. Jackelyn Young  2. White coat syndrome with diagnosis of hypertension - pt is monitoring BPs at home  3. Age-related osteoporosis without current pathological fracture  4. Atrophic vaginitis

## 2022-01-24 DIAGNOSIS — L400 Psoriasis vulgaris: Secondary | ICD-10-CM | POA: Diagnosis not present

## 2022-01-30 DIAGNOSIS — L400 Psoriasis vulgaris: Secondary | ICD-10-CM | POA: Diagnosis not present

## 2022-02-06 DIAGNOSIS — L400 Psoriasis vulgaris: Secondary | ICD-10-CM | POA: Diagnosis not present

## 2022-02-13 ENCOUNTER — Encounter (HOSPITAL_BASED_OUTPATIENT_CLINIC_OR_DEPARTMENT_OTHER): Payer: Self-pay | Admitting: *Deleted

## 2022-02-13 DIAGNOSIS — L400 Psoriasis vulgaris: Secondary | ICD-10-CM | POA: Diagnosis not present

## 2022-02-17 DIAGNOSIS — L400 Psoriasis vulgaris: Secondary | ICD-10-CM | POA: Diagnosis not present

## 2022-02-20 DIAGNOSIS — H6123 Impacted cerumen, bilateral: Secondary | ICD-10-CM | POA: Diagnosis not present

## 2022-02-21 DIAGNOSIS — H40013 Open angle with borderline findings, low risk, bilateral: Secondary | ICD-10-CM | POA: Diagnosis not present

## 2022-02-22 DIAGNOSIS — L400 Psoriasis vulgaris: Secondary | ICD-10-CM | POA: Diagnosis not present

## 2022-03-01 DIAGNOSIS — L400 Psoriasis vulgaris: Secondary | ICD-10-CM | POA: Diagnosis not present

## 2022-03-04 DIAGNOSIS — Z20822 Contact with and (suspected) exposure to covid-19: Secondary | ICD-10-CM | POA: Diagnosis not present

## 2022-03-07 DIAGNOSIS — M545 Low back pain, unspecified: Secondary | ICD-10-CM | POA: Diagnosis not present

## 2022-03-08 DIAGNOSIS — L400 Psoriasis vulgaris: Secondary | ICD-10-CM | POA: Diagnosis not present

## 2022-03-15 DIAGNOSIS — L400 Psoriasis vulgaris: Secondary | ICD-10-CM | POA: Diagnosis not present

## 2022-03-22 DIAGNOSIS — L400 Psoriasis vulgaris: Secondary | ICD-10-CM | POA: Diagnosis not present

## 2022-03-24 ENCOUNTER — Ambulatory Visit (HOSPITAL_BASED_OUTPATIENT_CLINIC_OR_DEPARTMENT_OTHER): Payer: Medicare Other | Attending: Sports Medicine | Admitting: Physical Therapy

## 2022-03-24 ENCOUNTER — Encounter (HOSPITAL_BASED_OUTPATIENT_CLINIC_OR_DEPARTMENT_OTHER): Payer: Self-pay | Admitting: Physical Therapy

## 2022-03-24 DIAGNOSIS — M5459 Other low back pain: Secondary | ICD-10-CM | POA: Diagnosis not present

## 2022-03-24 DIAGNOSIS — M6281 Muscle weakness (generalized): Secondary | ICD-10-CM | POA: Diagnosis not present

## 2022-03-24 DIAGNOSIS — R262 Difficulty in walking, not elsewhere classified: Secondary | ICD-10-CM | POA: Diagnosis not present
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
## 2022-03-25 ENCOUNTER — Encounter (HOSPITAL_BASED_OUTPATIENT_CLINIC_OR_DEPARTMENT_OTHER): Payer: Self-pay | Admitting: Physical Therapy

## 2022-03-28 DIAGNOSIS — L400 Psoriasis vulgaris: Secondary | ICD-10-CM | POA: Diagnosis not present

## 2022-03-29 ENCOUNTER — Encounter (HOSPITAL_BASED_OUTPATIENT_CLINIC_OR_DEPARTMENT_OTHER): Payer: Medicare Other | Admitting: Physical Therapy

## 2022-03-30 ENCOUNTER — Encounter (HOSPITAL_BASED_OUTPATIENT_CLINIC_OR_DEPARTMENT_OTHER): Payer: Self-pay | Admitting: Physical Therapy

## 2022-03-30 ENCOUNTER — Ambulatory Visit (HOSPITAL_BASED_OUTPATIENT_CLINIC_OR_DEPARTMENT_OTHER): Payer: Medicare Other | Attending: Sports Medicine | Admitting: Physical Therapy

## 2022-03-30 DIAGNOSIS — Z20822 Contact with and (suspected) exposure to covid-19: Secondary | ICD-10-CM | POA: Diagnosis not present

## 2022-03-30 DIAGNOSIS — R262 Difficulty in walking, not elsewhere classified: Secondary | ICD-10-CM | POA: Diagnosis not present

## 2022-03-30 DIAGNOSIS — M5459 Other low back pain: Secondary | ICD-10-CM

## 2022-03-30 DIAGNOSIS — M6281 Muscle weakness (generalized): Secondary | ICD-10-CM

## 2022-03-30 NOTE — Therapy (Addendum)
OUTPATIENT PHYSICAL THERAPY TREATMENT NOTE   Patient Name: Denise Young MRN: [id number] DOB:07/26/1951, 71 y.o., female Today's Date: 03/30/2022  PCP: Velna Hatchet, MD REFERRING PROVIDER: Velna Hatchet, MD  END OF SESSION:   PT End of Session - 03/30/22 1522     Visit Number 2    Number of Visits 18    Date for PT Re-Evaluation 05/26/22    Authorization Type Medicare    Progress Note Due on Visit 10    PT Start Time 1518    PT Stop Time 1558    PT Time Calculation (min) 40 min    Activity Tolerance Patient tolerated treatment well    Behavior During Therapy [facility] for tasks assessed/performed             Past Medical History:  Diagnosis Date   Atrophic vaginitis 03/20/2013   ELEVATED BP READING WITHOUT DX HYPERTENSION 10/04/2010   FIBROMYALGIA 10/04/2010   IBS 10/26/2010   Osteopenia    Osteoporosis    in the spine   Other anxiety states 10/26/2010   PALPITATIONS, RECURRENT 10/26/2010   Perforated diverticulum 07/2019   RHINITIS 11/23/2010   SINUSITIS - ACUTE-NOS 11/28/2010   SLEEP DISORDER/DISTURBANCE 10/04/2010   THYROID NODULE 10/04/2010   Past Surgical History:  Procedure Laterality Date   BREAST SURGERY  12/25/1990 and 12/25/1992   2 Done on Rt breast   CESAREAN SECTION  12/26/1987   DILATION AND CURETTAGE OF UTERUS  2004   ESOPHAGOGASTRODUODENOSCOPY ENDOSCOPY  10/22/2019   TEMPOROMANDIBULAR JOINT SURGERY  12/25/1993   Patient Active Problem List   Diagnosis Date Noted   Recurrent Clostridioides difficile diarrhea 12/01/2020   Immunization due 12/01/2020   Perforated diverticulum of duodenum 08/17/2019   White coat syndrome with diagnosis of hypertension 11/28/2016   Psoriasis 09/21/2016   Osteoporosis 04/01/2014   Atrophic vaginitis 03/20/2013   IBS 10/26/2010   PALPITATIONS, RECURRENT 10/26/2010   Thyroid nodule 10/04/2010   FIBROMYALGIA 10/04/2010   Disturbance in sleep behavior 10/04/2010    REFERRING DIAG: Lower back pain Lumbar  sprain  THERAPY DIAG:  Other low back pain  Difficulty in walking, not elsewhere classified  Muscle weakness (generalized)  PERTINENT HISTORY: Osteoporosis, fibromyalgia  PRECAUTIONS: none  SUBJECTIVE: was a little nervous about exercise but ready to try the pool. Feeling ok today.   PAIN:  Are you having pain? No    OBJECTIVE:      PATIENT SURVEYS:  FOTO 52   COGNITION:           Overall cognitive status: Within functional limits for tasks assessed                          SENSATION: Some numbness in left upper thigh that has diminished    MUSCLE LENGTH: Hamstrings: bilateral limited to about 40 deg, spasm resistance   POSTURE:  Mild scapular winging, more on Right than Lt   PALPATION: Capsular end feel in Rt hip IR but all other motions are stretchy end feels, tightness in lumbar paraspinals with hypertrophy noted in standing.    LUMBAR ROM:    WFL for tasks visualized, encouraged to limit bending/lifting/twisting to end ranges   LE ROM:   Limited hip IR on the Right vs Left.    LE MMT:   MMT Right 03/24/2022 Left 03/24/2022  Hip flexion 4-/5 3/5  Hip extension      Hip abduction 4/5 4/5  Hip adduction  Hip internal rotation      Hip external rotation      Knee flexion      Knee extension       (Blank rows = not tested)     GAIT: No abnormalities noted       TODAY'S TREATMENT  4/6: Pt entered pool via stairs Utilized bilateral handrails Depth up to 4'5"  AquaticREHABdocumentation: Pt is unable to tolerate land due to pain or inability to move freely on land without pain and substitution/compensations , Water will allow for reduced gait deviation due to reduced joint loading through buoyancy to help patient improve posture without excess stress and pain. , and Hydrostatic pressure also supports joints by unweighting joint load by at least 50 % in 3-4 feet depth water. 80% in chest to neck deep water.   stepping- fwd, side- with and  without UE abd/add, back- UEs in abd for resistance to scap retraction Wall sit straight arm press down blue noodle 1x20, without noodle 1x10 Seated on bench blue floats to assist horiz abd & rows with scap retraction & shoulder rolls Seated hip flexion+abd/adduction arcs; SLR for active HSS Walking for cool down   PATIENT EDUCATION:  Education details: Anatomy of condition, POC, HEP, exercise form/rationale, aquatics Person educated: Patient Education method: Theatre stage manager Education comprehension: verbalized understanding and needs further education     HOME EXERCISE PROGRAM: Regular mobility, mind-body awareness   ASSESSMENT:   CLINICAL IMPRESSION: Could really feel activation of lats and lower traps after backward stepping and press down in chair- reduced resistance. Will cont with active rest breaks for awareness of sensation and tolerance.      OBJECTIVE IMPAIRMENTS decreased activity tolerance, difficulty walking, decreased strength, increased muscle spasms, impaired flexibility, improper body mechanics, postural dysfunction, and pain.    ACTIVITY LIMITATIONS community activity, driving, shopping, and physical fitness .    PERSONAL FACTORS 1-2 comorbidities: osteoporosis, fibromyalgia  are also affecting patient's functional outcome.      REHAB POTENTIAL: Good   CLINICAL DECISION MAKING: Evolving/moderate complexity   EVALUATION COMPLEXITY: Moderate     GOALS: Goals reviewed with patient? Yes   SHORT TERM GOALS: Target date: 04/14/2022   Pt and aquatic therapist will determine proper level of exercise to avoid next day "crash" Baseline: Goal status: INITIAL     LONG TERM GOALS: Target date:  05/26/22   Gross hip strength to 5/5 Baseline:  Goal status: INITIAL   2.  Hamstring flexibility to at least 65 deg bilat Baseline: visually 45 deg at eval Goal status: INITIAL   3.  FOTO to meet stateed goal Baseline:  Goal status: INITIAL   4.  Pt will  be able to walk to the front of her neighborhood and home without limitation by pain Baseline:  Goal status: INITIAL         PLAN: PT FREQUENCY: 2x/week   PT DURATION: 10 weeks   PLANNED INTERVENTIONS: Therapeutic exercises, Therapeutic activity, Neuromuscular re-education, Balance training, Gait training, Patient/Family education, Joint mobilization, Stair training, Aquatic Therapy, Spinal mobilization, Cryotherapy, Moist heat, Taping, and Manual therapy.   PLAN FOR NEXT SESSION: continue aquatics based on tolerance from this visit   Daimion Adamcik C. Castle Lamons PT, DPT 03/30/22 4:45 PM     PHYSICAL THERAPY DISCHARGE SUMMARY  Visits from Start of Care: 2  Current functional level related to goals / functional outcomes: See above   Remaining deficits: See above   Education / Equipment: Anatomy of condition, POC, HEP, exercise form/rationale  Patient agrees to discharge. Patient goals were not met. Patient is being discharged due to not returning since the last visit. Ileana Chalupa C. Milka Windholz PT, DPT 10/13/22 12:13 PM

## 2022-04-04 DIAGNOSIS — L400 Psoriasis vulgaris: Secondary | ICD-10-CM | POA: Diagnosis not present

## 2022-04-07 ENCOUNTER — Encounter (HOSPITAL_BASED_OUTPATIENT_CLINIC_OR_DEPARTMENT_OTHER): Payer: Self-pay | Admitting: Physical Therapy

## 2022-04-10 DIAGNOSIS — L400 Psoriasis vulgaris: Secondary | ICD-10-CM | POA: Diagnosis not present

## 2022-04-17 DIAGNOSIS — L400 Psoriasis vulgaris: Secondary | ICD-10-CM | POA: Diagnosis not present

## 2022-04-21 DIAGNOSIS — L400 Psoriasis vulgaris: Secondary | ICD-10-CM | POA: Diagnosis not present

## 2022-04-27 DIAGNOSIS — L400 Psoriasis vulgaris: Secondary | ICD-10-CM | POA: Diagnosis not present

## 2022-04-27 DIAGNOSIS — U071 COVID-19: Secondary | ICD-10-CM | POA: Diagnosis not present

## 2022-05-03 DIAGNOSIS — E041 Nontoxic single thyroid nodule: Secondary | ICD-10-CM | POA: Diagnosis not present

## 2022-05-03 DIAGNOSIS — E785 Hyperlipidemia, unspecified: Secondary | ICD-10-CM | POA: Diagnosis not present

## 2022-05-03 DIAGNOSIS — I10 Essential (primary) hypertension: Secondary | ICD-10-CM | POA: Diagnosis not present

## 2022-05-03 DIAGNOSIS — L400 Psoriasis vulgaris: Secondary | ICD-10-CM | POA: Diagnosis not present

## 2022-05-03 DIAGNOSIS — R5383 Other fatigue: Secondary | ICD-10-CM | POA: Diagnosis not present

## 2022-05-03 DIAGNOSIS — E559 Vitamin D deficiency, unspecified: Secondary | ICD-10-CM | POA: Diagnosis not present

## 2022-05-09 DIAGNOSIS — H6123 Impacted cerumen, bilateral: Secondary | ICD-10-CM | POA: Diagnosis not present

## 2022-05-10 DIAGNOSIS — F419 Anxiety disorder, unspecified: Secondary | ICD-10-CM | POA: Diagnosis not present

## 2022-05-10 DIAGNOSIS — M797 Fibromyalgia: Secondary | ICD-10-CM | POA: Diagnosis not present

## 2022-05-10 DIAGNOSIS — R5383 Other fatigue: Secondary | ICD-10-CM | POA: Diagnosis not present

## 2022-05-10 DIAGNOSIS — Z1339 Encounter for screening examination for other mental health and behavioral disorders: Secondary | ICD-10-CM | POA: Diagnosis not present

## 2022-05-10 DIAGNOSIS — R809 Proteinuria, unspecified: Secondary | ICD-10-CM | POA: Diagnosis not present

## 2022-05-10 DIAGNOSIS — Z1331 Encounter for screening for depression: Secondary | ICD-10-CM | POA: Diagnosis not present

## 2022-05-10 DIAGNOSIS — I10 Essential (primary) hypertension: Secondary | ICD-10-CM | POA: Diagnosis not present

## 2022-05-10 DIAGNOSIS — M810 Age-related osteoporosis without current pathological fracture: Secondary | ICD-10-CM | POA: Diagnosis not present

## 2022-05-10 DIAGNOSIS — E785 Hyperlipidemia, unspecified: Secondary | ICD-10-CM | POA: Diagnosis not present

## 2022-05-10 DIAGNOSIS — Z0000 Encounter for general adult medical examination without abnormal findings: Secondary | ICD-10-CM | POA: Diagnosis not present

## 2022-05-10 DIAGNOSIS — E041 Nontoxic single thyroid nodule: Secondary | ICD-10-CM | POA: Diagnosis not present

## 2022-05-10 DIAGNOSIS — G4700 Insomnia, unspecified: Secondary | ICD-10-CM | POA: Diagnosis not present

## 2022-05-10 DIAGNOSIS — L400 Psoriasis vulgaris: Secondary | ICD-10-CM | POA: Diagnosis not present

## 2022-05-17 DIAGNOSIS — L400 Psoriasis vulgaris: Secondary | ICD-10-CM | POA: Diagnosis not present

## 2022-05-24 DIAGNOSIS — L400 Psoriasis vulgaris: Secondary | ICD-10-CM | POA: Diagnosis not present

## 2022-05-31 DIAGNOSIS — L400 Psoriasis vulgaris: Secondary | ICD-10-CM | POA: Diagnosis not present

## 2022-06-06 DIAGNOSIS — M810 Age-related osteoporosis without current pathological fracture: Secondary | ICD-10-CM | POA: Diagnosis not present

## 2022-06-07 DIAGNOSIS — L400 Psoriasis vulgaris: Secondary | ICD-10-CM | POA: Diagnosis not present

## 2022-06-14 DIAGNOSIS — L400 Psoriasis vulgaris: Secondary | ICD-10-CM | POA: Diagnosis not present

## 2022-06-21 DIAGNOSIS — L400 Psoriasis vulgaris: Secondary | ICD-10-CM | POA: Diagnosis not present

## 2022-06-28 DIAGNOSIS — L400 Psoriasis vulgaris: Secondary | ICD-10-CM | POA: Diagnosis not present

## 2022-07-06 DIAGNOSIS — L400 Psoriasis vulgaris: Secondary | ICD-10-CM | POA: Diagnosis not present

## 2022-07-13 DIAGNOSIS — L400 Psoriasis vulgaris: Secondary | ICD-10-CM | POA: Diagnosis not present

## 2022-07-20 DIAGNOSIS — L400 Psoriasis vulgaris: Secondary | ICD-10-CM | POA: Diagnosis not present

## 2022-07-25 DIAGNOSIS — H9121 Sudden idiopathic hearing loss, right ear: Secondary | ICD-10-CM | POA: Diagnosis not present

## 2022-07-25 DIAGNOSIS — H903 Sensorineural hearing loss, bilateral: Secondary | ICD-10-CM | POA: Diagnosis not present

## 2022-07-28 DIAGNOSIS — L400 Psoriasis vulgaris: Secondary | ICD-10-CM | POA: Diagnosis not present

## 2022-08-09 DIAGNOSIS — R638 Other symptoms and signs concerning food and fluid intake: Secondary | ICD-10-CM | POA: Diagnosis not present

## 2022-08-09 DIAGNOSIS — R0981 Nasal congestion: Secondary | ICD-10-CM | POA: Diagnosis not present

## 2022-08-09 DIAGNOSIS — H9201 Otalgia, right ear: Secondary | ICD-10-CM | POA: Diagnosis not present

## 2022-08-09 DIAGNOSIS — Z1152 Encounter for screening for COVID-19: Secondary | ICD-10-CM | POA: Diagnosis not present

## 2022-08-09 DIAGNOSIS — J019 Acute sinusitis, unspecified: Secondary | ICD-10-CM | POA: Diagnosis not present

## 2022-08-09 DIAGNOSIS — R5383 Other fatigue: Secondary | ICD-10-CM | POA: Diagnosis not present

## 2022-08-09 DIAGNOSIS — I10 Essential (primary) hypertension: Secondary | ICD-10-CM | POA: Diagnosis not present

## 2022-08-10 DIAGNOSIS — L400 Psoriasis vulgaris: Secondary | ICD-10-CM | POA: Diagnosis not present

## 2022-08-21 DIAGNOSIS — Z8619 Personal history of other infectious and parasitic diseases: Secondary | ICD-10-CM | POA: Diagnosis not present

## 2022-08-21 DIAGNOSIS — R0981 Nasal congestion: Secondary | ICD-10-CM | POA: Diagnosis not present

## 2022-08-21 DIAGNOSIS — J029 Acute pharyngitis, unspecified: Secondary | ICD-10-CM | POA: Diagnosis not present

## 2022-08-21 DIAGNOSIS — J0110 Acute frontal sinusitis, unspecified: Secondary | ICD-10-CM | POA: Diagnosis not present

## 2022-08-21 DIAGNOSIS — R059 Cough, unspecified: Secondary | ICD-10-CM | POA: Diagnosis not present

## 2022-08-21 DIAGNOSIS — R5383 Other fatigue: Secondary | ICD-10-CM | POA: Diagnosis not present

## 2022-08-21 DIAGNOSIS — Z1152 Encounter for screening for COVID-19: Secondary | ICD-10-CM | POA: Diagnosis not present

## 2022-08-24 DIAGNOSIS — L400 Psoriasis vulgaris: Secondary | ICD-10-CM | POA: Diagnosis not present

## 2022-08-30 DIAGNOSIS — L400 Psoriasis vulgaris: Secondary | ICD-10-CM | POA: Diagnosis not present

## 2022-09-06 DIAGNOSIS — L400 Psoriasis vulgaris: Secondary | ICD-10-CM | POA: Diagnosis not present

## 2022-09-15 DIAGNOSIS — Z23 Encounter for immunization: Secondary | ICD-10-CM | POA: Diagnosis not present

## 2022-09-20 DIAGNOSIS — L400 Psoriasis vulgaris: Secondary | ICD-10-CM | POA: Diagnosis not present

## 2022-09-27 DIAGNOSIS — L400 Psoriasis vulgaris: Secondary | ICD-10-CM | POA: Diagnosis not present

## 2022-10-11 DIAGNOSIS — L400 Psoriasis vulgaris: Secondary | ICD-10-CM | POA: Diagnosis not present

## 2022-10-18 DIAGNOSIS — L400 Psoriasis vulgaris: Secondary | ICD-10-CM | POA: Diagnosis not present

## 2022-10-23 DIAGNOSIS — H9313 Tinnitus, bilateral: Secondary | ICD-10-CM | POA: Diagnosis not present

## 2022-10-23 DIAGNOSIS — H9113 Presbycusis, bilateral: Secondary | ICD-10-CM | POA: Diagnosis not present

## 2022-10-24 DIAGNOSIS — L821 Other seborrheic keratosis: Secondary | ICD-10-CM | POA: Diagnosis not present

## 2022-10-24 DIAGNOSIS — L718 Other rosacea: Secondary | ICD-10-CM | POA: Diagnosis not present

## 2022-10-24 DIAGNOSIS — D225 Melanocytic nevi of trunk: Secondary | ICD-10-CM | POA: Diagnosis not present

## 2022-10-24 DIAGNOSIS — L400 Psoriasis vulgaris: Secondary | ICD-10-CM | POA: Diagnosis not present

## 2022-10-26 DIAGNOSIS — Z7185 Encounter for immunization safety counseling: Secondary | ICD-10-CM | POA: Diagnosis not present

## 2022-10-26 DIAGNOSIS — M810 Age-related osteoporosis without current pathological fracture: Secondary | ICD-10-CM | POA: Diagnosis not present

## 2022-10-26 DIAGNOSIS — Z8619 Personal history of other infectious and parasitic diseases: Secondary | ICD-10-CM | POA: Diagnosis not present

## 2022-10-26 DIAGNOSIS — M797 Fibromyalgia: Secondary | ICD-10-CM | POA: Diagnosis not present

## 2022-10-26 DIAGNOSIS — I10 Essential (primary) hypertension: Secondary | ICD-10-CM | POA: Diagnosis not present

## 2022-10-31 DIAGNOSIS — L400 Psoriasis vulgaris: Secondary | ICD-10-CM | POA: Diagnosis not present

## 2022-11-03 DIAGNOSIS — J0110 Acute frontal sinusitis, unspecified: Secondary | ICD-10-CM | POA: Diagnosis not present

## 2022-11-03 DIAGNOSIS — Z8619 Personal history of other infectious and parasitic diseases: Secondary | ICD-10-CM | POA: Diagnosis not present

## 2022-11-03 DIAGNOSIS — R5383 Other fatigue: Secondary | ICD-10-CM | POA: Diagnosis not present

## 2022-11-03 DIAGNOSIS — Z1152 Encounter for screening for COVID-19: Secondary | ICD-10-CM | POA: Diagnosis not present

## 2022-11-03 DIAGNOSIS — R0981 Nasal congestion: Secondary | ICD-10-CM | POA: Diagnosis not present

## 2022-11-09 DIAGNOSIS — L400 Psoriasis vulgaris: Secondary | ICD-10-CM | POA: Diagnosis not present

## 2022-11-10 DIAGNOSIS — E041 Nontoxic single thyroid nodule: Secondary | ICD-10-CM | POA: Diagnosis not present

## 2022-11-10 DIAGNOSIS — E559 Vitamin D deficiency, unspecified: Secondary | ICD-10-CM | POA: Diagnosis not present

## 2022-11-10 DIAGNOSIS — I10 Essential (primary) hypertension: Secondary | ICD-10-CM | POA: Diagnosis not present

## 2022-11-10 DIAGNOSIS — R809 Proteinuria, unspecified: Secondary | ICD-10-CM | POA: Diagnosis not present

## 2022-11-10 DIAGNOSIS — L409 Psoriasis, unspecified: Secondary | ICD-10-CM | POA: Diagnosis not present

## 2022-11-10 DIAGNOSIS — E785 Hyperlipidemia, unspecified: Secondary | ICD-10-CM | POA: Diagnosis not present

## 2022-11-10 DIAGNOSIS — F419 Anxiety disorder, unspecified: Secondary | ICD-10-CM | POA: Diagnosis not present

## 2022-11-10 DIAGNOSIS — J0110 Acute frontal sinusitis, unspecified: Secondary | ICD-10-CM | POA: Diagnosis not present

## 2022-11-10 DIAGNOSIS — M810 Age-related osteoporosis without current pathological fracture: Secondary | ICD-10-CM | POA: Diagnosis not present

## 2022-11-10 DIAGNOSIS — R0981 Nasal congestion: Secondary | ICD-10-CM | POA: Diagnosis not present

## 2022-11-10 DIAGNOSIS — M797 Fibromyalgia: Secondary | ICD-10-CM | POA: Diagnosis not present

## 2022-11-10 DIAGNOSIS — R5383 Other fatigue: Secondary | ICD-10-CM | POA: Diagnosis not present

## 2022-11-14 DIAGNOSIS — L400 Psoriasis vulgaris: Secondary | ICD-10-CM | POA: Diagnosis not present

## 2022-11-21 DIAGNOSIS — L400 Psoriasis vulgaris: Secondary | ICD-10-CM | POA: Diagnosis not present

## 2022-11-28 DIAGNOSIS — L400 Psoriasis vulgaris: Secondary | ICD-10-CM | POA: Diagnosis not present

## 2022-12-05 DIAGNOSIS — L400 Psoriasis vulgaris: Secondary | ICD-10-CM | POA: Diagnosis not present

## 2022-12-12 DIAGNOSIS — L400 Psoriasis vulgaris: Secondary | ICD-10-CM | POA: Diagnosis not present

## 2022-12-19 DIAGNOSIS — L400 Psoriasis vulgaris: Secondary | ICD-10-CM | POA: Diagnosis not present

## 2022-12-26 DIAGNOSIS — L400 Psoriasis vulgaris: Secondary | ICD-10-CM | POA: Diagnosis not present

## 2023-01-02 DIAGNOSIS — L400 Psoriasis vulgaris: Secondary | ICD-10-CM | POA: Diagnosis not present

## 2023-01-04 DIAGNOSIS — Z1231 Encounter for screening mammogram for malignant neoplasm of breast: Secondary | ICD-10-CM | POA: Diagnosis not present

## 2023-01-04 DIAGNOSIS — H6123 Impacted cerumen, bilateral: Secondary | ICD-10-CM | POA: Diagnosis not present

## 2023-01-09 ENCOUNTER — Encounter (HOSPITAL_BASED_OUTPATIENT_CLINIC_OR_DEPARTMENT_OTHER): Payer: Self-pay | Admitting: *Deleted

## 2023-01-16 DIAGNOSIS — R21 Rash and other nonspecific skin eruption: Secondary | ICD-10-CM | POA: Diagnosis not present

## 2023-01-16 DIAGNOSIS — L821 Other seborrheic keratosis: Secondary | ICD-10-CM | POA: Diagnosis not present

## 2023-01-18 DIAGNOSIS — L400 Psoriasis vulgaris: Secondary | ICD-10-CM | POA: Diagnosis not present

## 2023-01-24 DIAGNOSIS — L400 Psoriasis vulgaris: Secondary | ICD-10-CM | POA: Diagnosis not present

## 2023-02-01 DIAGNOSIS — L400 Psoriasis vulgaris: Secondary | ICD-10-CM | POA: Diagnosis not present

## 2023-02-06 DIAGNOSIS — L400 Psoriasis vulgaris: Secondary | ICD-10-CM | POA: Diagnosis not present

## 2023-02-14 DIAGNOSIS — L400 Psoriasis vulgaris: Secondary | ICD-10-CM | POA: Diagnosis not present

## 2023-02-21 DIAGNOSIS — L400 Psoriasis vulgaris: Secondary | ICD-10-CM | POA: Diagnosis not present

## 2023-03-01 DIAGNOSIS — L400 Psoriasis vulgaris: Secondary | ICD-10-CM | POA: Diagnosis not present

## 2023-03-06 DIAGNOSIS — H40013 Open angle with borderline findings, low risk, bilateral: Secondary | ICD-10-CM | POA: Diagnosis not present

## 2023-03-07 DIAGNOSIS — H6123 Impacted cerumen, bilateral: Secondary | ICD-10-CM | POA: Diagnosis not present

## 2023-03-07 DIAGNOSIS — H903 Sensorineural hearing loss, bilateral: Secondary | ICD-10-CM | POA: Diagnosis not present

## 2023-03-08 DIAGNOSIS — L400 Psoriasis vulgaris: Secondary | ICD-10-CM | POA: Diagnosis not present

## 2023-03-15 DIAGNOSIS — L400 Psoriasis vulgaris: Secondary | ICD-10-CM | POA: Diagnosis not present

## 2023-03-21 DIAGNOSIS — H903 Sensorineural hearing loss, bilateral: Secondary | ICD-10-CM | POA: Diagnosis not present

## 2023-03-22 DIAGNOSIS — L400 Psoriasis vulgaris: Secondary | ICD-10-CM | POA: Diagnosis not present

## 2023-03-29 DIAGNOSIS — L400 Psoriasis vulgaris: Secondary | ICD-10-CM | POA: Diagnosis not present

## 2023-04-09 DIAGNOSIS — L400 Psoriasis vulgaris: Secondary | ICD-10-CM | POA: Diagnosis not present

## 2023-04-23 DIAGNOSIS — L400 Psoriasis vulgaris: Secondary | ICD-10-CM | POA: Diagnosis not present

## 2023-04-26 DIAGNOSIS — G4709 Other insomnia: Secondary | ICD-10-CM | POA: Diagnosis not present

## 2023-04-26 DIAGNOSIS — I10 Essential (primary) hypertension: Secondary | ICD-10-CM | POA: Diagnosis not present

## 2023-04-26 DIAGNOSIS — Z8619 Personal history of other infectious and parasitic diseases: Secondary | ICD-10-CM | POA: Diagnosis not present

## 2023-04-26 DIAGNOSIS — M810 Age-related osteoporosis without current pathological fracture: Secondary | ICD-10-CM | POA: Diagnosis not present

## 2023-04-26 DIAGNOSIS — L400 Psoriasis vulgaris: Secondary | ICD-10-CM | POA: Diagnosis not present

## 2023-04-26 DIAGNOSIS — L709 Acne, unspecified: Secondary | ICD-10-CM | POA: Diagnosis not present

## 2023-04-26 DIAGNOSIS — Z7185 Encounter for immunization safety counseling: Secondary | ICD-10-CM | POA: Diagnosis not present

## 2023-04-26 DIAGNOSIS — M797 Fibromyalgia: Secondary | ICD-10-CM | POA: Diagnosis not present

## 2023-05-02 DIAGNOSIS — L400 Psoriasis vulgaris: Secondary | ICD-10-CM | POA: Diagnosis not present

## 2023-05-10 DIAGNOSIS — L400 Psoriasis vulgaris: Secondary | ICD-10-CM | POA: Diagnosis not present

## 2023-05-11 DIAGNOSIS — E785 Hyperlipidemia, unspecified: Secondary | ICD-10-CM | POA: Diagnosis not present

## 2023-05-11 DIAGNOSIS — R7989 Other specified abnormal findings of blood chemistry: Secondary | ICD-10-CM | POA: Diagnosis not present

## 2023-05-11 DIAGNOSIS — E559 Vitamin D deficiency, unspecified: Secondary | ICD-10-CM | POA: Diagnosis not present

## 2023-05-11 DIAGNOSIS — E041 Nontoxic single thyroid nodule: Secondary | ICD-10-CM | POA: Diagnosis not present

## 2023-05-11 DIAGNOSIS — K219 Gastro-esophageal reflux disease without esophagitis: Secondary | ICD-10-CM | POA: Diagnosis not present

## 2023-05-14 DIAGNOSIS — L400 Psoriasis vulgaris: Secondary | ICD-10-CM | POA: Diagnosis not present

## 2023-05-24 DIAGNOSIS — L400 Psoriasis vulgaris: Secondary | ICD-10-CM | POA: Diagnosis not present

## 2023-05-25 ENCOUNTER — Other Ambulatory Visit: Payer: Self-pay | Admitting: Internal Medicine

## 2023-05-25 DIAGNOSIS — Z1331 Encounter for screening for depression: Secondary | ICD-10-CM | POA: Diagnosis not present

## 2023-05-25 DIAGNOSIS — L409 Psoriasis, unspecified: Secondary | ICD-10-CM | POA: Diagnosis not present

## 2023-05-25 DIAGNOSIS — E785 Hyperlipidemia, unspecified: Secondary | ICD-10-CM | POA: Diagnosis not present

## 2023-05-25 DIAGNOSIS — Z0000 Encounter for general adult medical examination without abnormal findings: Secondary | ICD-10-CM | POA: Diagnosis not present

## 2023-05-25 DIAGNOSIS — R911 Solitary pulmonary nodule: Secondary | ICD-10-CM | POA: Diagnosis not present

## 2023-05-25 DIAGNOSIS — M810 Age-related osteoporosis without current pathological fracture: Secondary | ICD-10-CM | POA: Diagnosis not present

## 2023-05-25 DIAGNOSIS — F419 Anxiety disorder, unspecified: Secondary | ICD-10-CM | POA: Diagnosis not present

## 2023-05-25 DIAGNOSIS — M797 Fibromyalgia: Secondary | ICD-10-CM | POA: Diagnosis not present

## 2023-05-25 DIAGNOSIS — E559 Vitamin D deficiency, unspecified: Secondary | ICD-10-CM | POA: Diagnosis not present

## 2023-05-25 DIAGNOSIS — R809 Proteinuria, unspecified: Secondary | ICD-10-CM | POA: Diagnosis not present

## 2023-05-25 DIAGNOSIS — I10 Essential (primary) hypertension: Secondary | ICD-10-CM | POA: Diagnosis not present

## 2023-05-25 DIAGNOSIS — R82998 Other abnormal findings in urine: Secondary | ICD-10-CM | POA: Diagnosis not present

## 2023-05-25 DIAGNOSIS — Z1339 Encounter for screening examination for other mental health and behavioral disorders: Secondary | ICD-10-CM | POA: Diagnosis not present

## 2023-05-28 DIAGNOSIS — L400 Psoriasis vulgaris: Secondary | ICD-10-CM | POA: Diagnosis not present

## 2023-05-31 ENCOUNTER — Encounter (HOSPITAL_BASED_OUTPATIENT_CLINIC_OR_DEPARTMENT_OTHER): Payer: Self-pay | Admitting: *Deleted

## 2023-05-31 DIAGNOSIS — L400 Psoriasis vulgaris: Secondary | ICD-10-CM | POA: Diagnosis not present

## 2023-06-04 DIAGNOSIS — L400 Psoriasis vulgaris: Secondary | ICD-10-CM | POA: Diagnosis not present

## 2023-06-07 DIAGNOSIS — H938X3 Other specified disorders of ear, bilateral: Secondary | ICD-10-CM | POA: Diagnosis not present

## 2023-06-07 DIAGNOSIS — H903 Sensorineural hearing loss, bilateral: Secondary | ICD-10-CM | POA: Diagnosis not present

## 2023-06-07 DIAGNOSIS — H9313 Tinnitus, bilateral: Secondary | ICD-10-CM | POA: Diagnosis not present

## 2023-06-11 DIAGNOSIS — L400 Psoriasis vulgaris: Secondary | ICD-10-CM | POA: Diagnosis not present

## 2023-06-19 DIAGNOSIS — L400 Psoriasis vulgaris: Secondary | ICD-10-CM | POA: Diagnosis not present

## 2023-06-26 ENCOUNTER — Ambulatory Visit
Admission: RE | Admit: 2023-06-26 | Discharge: 2023-06-26 | Disposition: A | Discharge status: Home / Self Care | Payer: Medicare Other | Source: Ambulatory Visit | Attending: Internal Medicine | Admitting: Internal Medicine

## 2023-06-26 DIAGNOSIS — R911 Solitary pulmonary nodule: Secondary | ICD-10-CM

## 2023-06-26 DIAGNOSIS — R918 Other nonspecific abnormal finding of lung field: Secondary | ICD-10-CM | POA: Diagnosis not present

## 2023-06-27 DIAGNOSIS — L400 Psoriasis vulgaris: Secondary | ICD-10-CM | POA: Diagnosis not present

## 2023-07-04 DIAGNOSIS — L400 Psoriasis vulgaris: Secondary | ICD-10-CM | POA: Diagnosis not present

## 2023-07-10 DIAGNOSIS — L400 Psoriasis vulgaris: Secondary | ICD-10-CM | POA: Diagnosis not present

## 2023-07-25 DIAGNOSIS — L400 Psoriasis vulgaris: Secondary | ICD-10-CM | POA: Diagnosis not present

## 2023-08-08 DIAGNOSIS — L218 Other seborrheic dermatitis: Secondary | ICD-10-CM | POA: Diagnosis not present

## 2023-08-08 DIAGNOSIS — L400 Psoriasis vulgaris: Secondary | ICD-10-CM | POA: Diagnosis not present

## 2023-08-08 DIAGNOSIS — L820 Inflamed seborrheic keratosis: Secondary | ICD-10-CM | POA: Diagnosis not present

## 2023-08-09 DIAGNOSIS — L719 Rosacea, unspecified: Secondary | ICD-10-CM | POA: Diagnosis not present

## 2023-08-09 DIAGNOSIS — L409 Psoriasis, unspecified: Secondary | ICD-10-CM | POA: Diagnosis not present

## 2023-08-17 DIAGNOSIS — L409 Psoriasis, unspecified: Secondary | ICD-10-CM | POA: Diagnosis not present

## 2023-09-03 DIAGNOSIS — L409 Psoriasis, unspecified: Secondary | ICD-10-CM | POA: Diagnosis not present

## 2023-09-10 DIAGNOSIS — L409 Psoriasis, unspecified: Secondary | ICD-10-CM | POA: Diagnosis not present

## 2024-01-08 ENCOUNTER — Other Ambulatory Visit: Payer: Self-pay | Admitting: Internal Medicine

## 2024-01-08 DIAGNOSIS — R911 Solitary pulmonary nodule: Secondary | ICD-10-CM

## 2024-01-09 ENCOUNTER — Encounter: Payer: Self-pay | Admitting: Obstetrics & Gynecology

## 2024-01-25 ENCOUNTER — Ambulatory Visit (HOSPITAL_BASED_OUTPATIENT_CLINIC_OR_DEPARTMENT_OTHER): Payer: Medicare Other | Admitting: Obstetrics & Gynecology

## 2024-03-24 ENCOUNTER — Encounter (HOSPITAL_BASED_OUTPATIENT_CLINIC_OR_DEPARTMENT_OTHER): Payer: Self-pay | Admitting: Obstetrics & Gynecology
